# Patient Record
Sex: Female | Born: 1950 | Race: Black or African American | Hispanic: No | Marital: Single | Smoking: Never smoker
Health system: Southern US, Community
[De-identification: ages and names within clinical notes are randomized; demographics above are authoritative.]

## PROBLEM LIST (undated history)

## (undated) DIAGNOSIS — G4730 Sleep apnea, unspecified: Secondary | ICD-10-CM

## (undated) DIAGNOSIS — I10 Essential (primary) hypertension: Secondary | ICD-10-CM

## (undated) DIAGNOSIS — D649 Anemia, unspecified: Secondary | ICD-10-CM

## (undated) DIAGNOSIS — H409 Unspecified glaucoma: Secondary | ICD-10-CM

## (undated) DIAGNOSIS — Z87442 Personal history of urinary calculi: Secondary | ICD-10-CM

## (undated) DIAGNOSIS — J45909 Unspecified asthma, uncomplicated: Secondary | ICD-10-CM

## (undated) DIAGNOSIS — M1990 Unspecified osteoarthritis, unspecified site: Secondary | ICD-10-CM

## (undated) DIAGNOSIS — E119 Type 2 diabetes mellitus without complications: Secondary | ICD-10-CM

## (undated) DIAGNOSIS — H9190 Unspecified hearing loss, unspecified ear: Secondary | ICD-10-CM

## (undated) HISTORY — PX: OTHER SURGICAL HISTORY: SHX169

## (undated) HISTORY — PX: TOE SURGERY: SHX1073

---

## 1996-04-15 HISTORY — PX: ABDOMINAL HYSTERECTOMY: SHX81

## 2003-04-16 HISTORY — PX: KNEE ARTHROSCOPY: SUR90

## 2003-05-20 ENCOUNTER — Other Ambulatory Visit: Payer: Self-pay

## 2004-07-09 ENCOUNTER — Ambulatory Visit: Payer: Self-pay | Admitting: Family Medicine

## 2005-01-17 ENCOUNTER — Ambulatory Visit: Payer: Self-pay | Admitting: Family Medicine

## 2006-12-11 ENCOUNTER — Emergency Department: Payer: Self-pay | Admitting: Emergency Medicine

## 2007-11-25 ENCOUNTER — Ambulatory Visit: Payer: Self-pay | Admitting: Pain Medicine

## 2007-12-02 ENCOUNTER — Ambulatory Visit: Payer: Self-pay | Admitting: Pain Medicine

## 2008-02-22 ENCOUNTER — Ambulatory Visit: Payer: Self-pay | Admitting: Pain Medicine

## 2008-02-24 ENCOUNTER — Ambulatory Visit: Payer: Self-pay | Admitting: Pain Medicine

## 2008-03-29 ENCOUNTER — Ambulatory Visit: Payer: Self-pay | Admitting: Pain Medicine

## 2008-04-04 ENCOUNTER — Ambulatory Visit: Payer: Self-pay

## 2008-04-05 ENCOUNTER — Ambulatory Visit: Payer: Self-pay | Admitting: Pain Medicine

## 2008-04-18 ENCOUNTER — Ambulatory Visit: Payer: Self-pay | Admitting: Pain Medicine

## 2008-04-19 ENCOUNTER — Ambulatory Visit: Payer: Self-pay | Admitting: Family Medicine

## 2008-05-10 ENCOUNTER — Ambulatory Visit: Payer: Self-pay | Admitting: Family Medicine

## 2008-05-10 ENCOUNTER — Ambulatory Visit: Payer: Self-pay | Admitting: Pain Medicine

## 2008-05-25 ENCOUNTER — Ambulatory Visit: Payer: Self-pay | Admitting: Pain Medicine

## 2008-06-22 ENCOUNTER — Ambulatory Visit: Payer: Self-pay | Admitting: Unknown Physician Specialty

## 2008-06-29 ENCOUNTER — Ambulatory Visit: Payer: Self-pay | Admitting: Unknown Physician Specialty

## 2008-12-05 ENCOUNTER — Ambulatory Visit: Payer: Self-pay | Admitting: Pain Medicine

## 2008-12-16 ENCOUNTER — Emergency Department: Payer: Self-pay | Admitting: Emergency Medicine

## 2009-01-03 ENCOUNTER — Ambulatory Visit: Payer: Self-pay | Admitting: Pain Medicine

## 2009-01-04 ENCOUNTER — Ambulatory Visit: Payer: Self-pay | Admitting: Family Medicine

## 2009-01-11 ENCOUNTER — Ambulatory Visit: Payer: Self-pay | Admitting: Pain Medicine

## 2009-01-17 ENCOUNTER — Ambulatory Visit: Payer: Self-pay | Admitting: Pain Medicine

## 2009-02-02 ENCOUNTER — Ambulatory Visit: Payer: Self-pay | Admitting: Family Medicine

## 2009-02-14 ENCOUNTER — Ambulatory Visit: Payer: Self-pay | Admitting: Pain Medicine

## 2009-03-16 ENCOUNTER — Ambulatory Visit: Payer: Self-pay | Admitting: Gastroenterology

## 2009-03-16 HISTORY — PX: COLONOSCOPY: SHX174

## 2009-03-23 ENCOUNTER — Ambulatory Visit: Payer: Self-pay | Admitting: Pain Medicine

## 2009-05-04 ENCOUNTER — Ambulatory Visit: Payer: Self-pay | Admitting: Podiatry

## 2009-08-30 ENCOUNTER — Ambulatory Visit: Payer: Self-pay | Admitting: Unknown Physician Specialty

## 2009-10-24 ENCOUNTER — Ambulatory Visit: Payer: Self-pay | Admitting: Family Medicine

## 2011-02-06 ENCOUNTER — Emergency Department: Payer: Self-pay | Admitting: Emergency Medicine

## 2011-04-24 DIAGNOSIS — H903 Sensorineural hearing loss, bilateral: Secondary | ICD-10-CM | POA: Insufficient documentation

## 2011-07-11 DIAGNOSIS — I10 Essential (primary) hypertension: Secondary | ICD-10-CM | POA: Insufficient documentation

## 2011-07-11 DIAGNOSIS — G8929 Other chronic pain: Secondary | ICD-10-CM | POA: Insufficient documentation

## 2011-07-26 ENCOUNTER — Ambulatory Visit: Payer: Self-pay | Admitting: Family Medicine

## 2011-11-19 ENCOUNTER — Ambulatory Visit: Payer: Self-pay | Admitting: Family Medicine

## 2012-06-22 ENCOUNTER — Emergency Department: Payer: Self-pay | Admitting: Emergency Medicine

## 2012-12-24 ENCOUNTER — Ambulatory Visit: Payer: Self-pay | Admitting: Family Medicine

## 2013-03-16 ENCOUNTER — Ambulatory Visit: Payer: Self-pay | Admitting: Pain Medicine

## 2013-03-23 ENCOUNTER — Ambulatory Visit: Payer: Self-pay | Admitting: Pain Medicine

## 2013-04-18 ENCOUNTER — Emergency Department: Payer: Self-pay | Admitting: Emergency Medicine

## 2013-04-18 LAB — BASIC METABOLIC PANEL
Anion Gap: 3 — ABNORMAL LOW (ref 7–16)
BUN: 16 mg/dL (ref 7–18)
CALCIUM: 10.3 mg/dL — AB (ref 8.5–10.1)
Chloride: 106 mmol/L (ref 98–107)
Co2: 29 mmol/L (ref 21–32)
Creatinine: 0.79 mg/dL (ref 0.60–1.30)
EGFR (African American): >60
EGFR (Non-African Amer.): >60
GLUCOSE: 126 mg/dL — AB (ref 65–99)
Osmolality: 278 (ref 275–301)
POTASSIUM: 4 mmol/L (ref 3.5–5.1)
Sodium: 138 mmol/L (ref 136–145)

## 2013-04-18 LAB — CBC
HCT: 38.6 % (ref 35.0–47.0)
HGB: 12.5 g/dL (ref 12.0–16.0)
MCH: 25.7 pg — ABNORMAL LOW (ref 26.0–34.0)
MCHC: 32.3 g/dL (ref 32.0–36.0)
MCV: 80 fL (ref 80–100)
Platelet: 202 10*3/uL (ref 150–440)
RBC: 4.85 10*6/uL (ref 3.80–5.20)
RDW: 14.1 % (ref 11.5–14.5)
WBC: 5.8 10*3/uL (ref 3.6–11.0)

## 2013-04-18 LAB — TROPONIN I: Troponin-I: <0.02 ng/mL

## 2013-05-05 ENCOUNTER — Ambulatory Visit: Payer: Self-pay | Admitting: Pain Medicine

## 2013-05-06 ENCOUNTER — Other Ambulatory Visit: Payer: Self-pay | Admitting: Neurosurgery

## 2013-05-06 DIAGNOSIS — M48061 Spinal stenosis, lumbar region without neurogenic claudication: Secondary | ICD-10-CM

## 2013-05-06 DIAGNOSIS — M5412 Radiculopathy, cervical region: Secondary | ICD-10-CM

## 2013-05-12 ENCOUNTER — Other Ambulatory Visit: Payer: Self-pay

## 2013-05-13 ENCOUNTER — Other Ambulatory Visit: Payer: Self-pay

## 2013-05-24 ENCOUNTER — Ambulatory Visit
Admission: RE | Admit: 2013-05-24 | Discharge: 2013-05-24 | Disposition: A | Discharge status: Home / Self Care | Payer: PRIVATE HEALTH INSURANCE | Source: Ambulatory Visit | Attending: Neurosurgery | Admitting: Neurosurgery

## 2013-05-24 VITALS — BP 119/65 | HR 67

## 2013-05-24 DIAGNOSIS — M5412 Radiculopathy, cervical region: Secondary | ICD-10-CM

## 2013-05-24 DIAGNOSIS — M48061 Spinal stenosis, lumbar region without neurogenic claudication: Secondary | ICD-10-CM

## 2013-05-24 MED ORDER — MEPERIDINE HCL 100 MG/ML IJ SOLN
100.0000 mg | Freq: Once | INTRAMUSCULAR | Status: AC
  Administered 2013-05-24: 100 mg via INTRAMUSCULAR

## 2013-05-24 MED ORDER — DIAZEPAM 5 MG PO TABS
5.0000 mg | ORAL_TABLET | Freq: Once | ORAL | Status: AC
  Administered 2013-05-24: 5 mg via ORAL

## 2013-05-24 MED ORDER — IOHEXOL 300 MG/ML  SOLN
10.0000 mL | Freq: Once | INTRAMUSCULAR | Status: AC | PRN
  Administered 2013-05-24: 10 mL via INTRATHECAL

## 2013-05-24 MED ORDER — ONDANSETRON HCL 4 MG/2ML IJ SOLN
4.0000 mg | Freq: Once | INTRAMUSCULAR | Status: AC
  Administered 2013-05-24: 4 mg via INTRAMUSCULAR

## 2013-05-24 NOTE — Discharge Instructions (Signed)

## 2013-06-08 ENCOUNTER — Ambulatory Visit: Payer: Self-pay | Admitting: Pain Medicine

## 2013-07-12 ENCOUNTER — Ambulatory Visit: Payer: Self-pay | Admitting: Pain Medicine

## 2013-07-15 ENCOUNTER — Emergency Department: Payer: Self-pay | Admitting: Emergency Medicine

## 2013-07-15 LAB — CBC WITH DIFFERENTIAL/PLATELET
Basophil #: 0.1 10*3/uL (ref 0.0–0.1)
Basophil %: 0.7 %
EOS PCT: 0.3 %
Eosinophil #: 0 10*3/uL (ref 0.0–0.7)
HCT: 40.5 % (ref 35.0–47.0)
HGB: 12.5 g/dL (ref 12.0–16.0)
LYMPHS PCT: 23.6 %
Lymphocyte #: 2.7 10*3/uL (ref 1.0–3.6)
MCH: 25.2 pg — ABNORMAL LOW (ref 26.0–34.0)
MCHC: 30.9 g/dL — AB (ref 32.0–36.0)
MCV: 81 fL (ref 80–100)
MONO ABS: 0.8 x10 3/mm (ref 0.2–0.9)
Monocyte %: 7.5 %
NEUTROS PCT: 67.9 %
Neutrophil #: 7.6 10*3/uL — ABNORMAL HIGH (ref 1.4–6.5)
PLATELETS: 201 10*3/uL (ref 150–440)
RBC: 4.97 10*6/uL (ref 3.80–5.20)
RDW: 14.1 % (ref 11.5–14.5)
WBC: 11.2 10*3/uL — AB (ref 3.6–11.0)

## 2013-07-15 LAB — BASIC METABOLIC PANEL
ANION GAP: 5 — AB (ref 7–16)
BUN: 18 mg/dL (ref 7–18)
CREATININE: 0.74 mg/dL (ref 0.60–1.30)
Calcium, Total: 9.6 mg/dL (ref 8.5–10.1)
Chloride: 106 mmol/L (ref 98–107)
Co2: 28 mmol/L (ref 21–32)
Glucose: 114 mg/dL — ABNORMAL HIGH (ref 65–99)
OSMOLALITY: 280 (ref 275–301)
Potassium: 4.3 mmol/L (ref 3.5–5.1)
SODIUM: 139 mmol/L (ref 136–145)

## 2013-08-10 ENCOUNTER — Ambulatory Visit: Payer: Self-pay | Admitting: Pain Medicine

## 2013-09-04 DIAGNOSIS — E1136 Type 2 diabetes mellitus with diabetic cataract: Secondary | ICD-10-CM | POA: Insufficient documentation

## 2014-01-05 ENCOUNTER — Ambulatory Visit: Payer: Self-pay | Admitting: Family Medicine
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
## 2014-11-07 DIAGNOSIS — E785 Hyperlipidemia, unspecified: Secondary | ICD-10-CM | POA: Insufficient documentation

## 2015-01-02 IMAGING — CR DG CHEST 2V
1 series · 2 of 2 positions shown · non-contrast
Comparison: DG MYELOGRAPHY LUMBAR INJ MULTI REGION dated 05/24/2013;
DG CHEST 2V dated 04/18/2013

CLINICAL DATA: cough; coughed up blood; images best possible

EXAM:
CHEST  2 VIEW

[Series 1: w chest pa · 0.14mm/px · 2 of 2 slices shown]
[im 1/2]
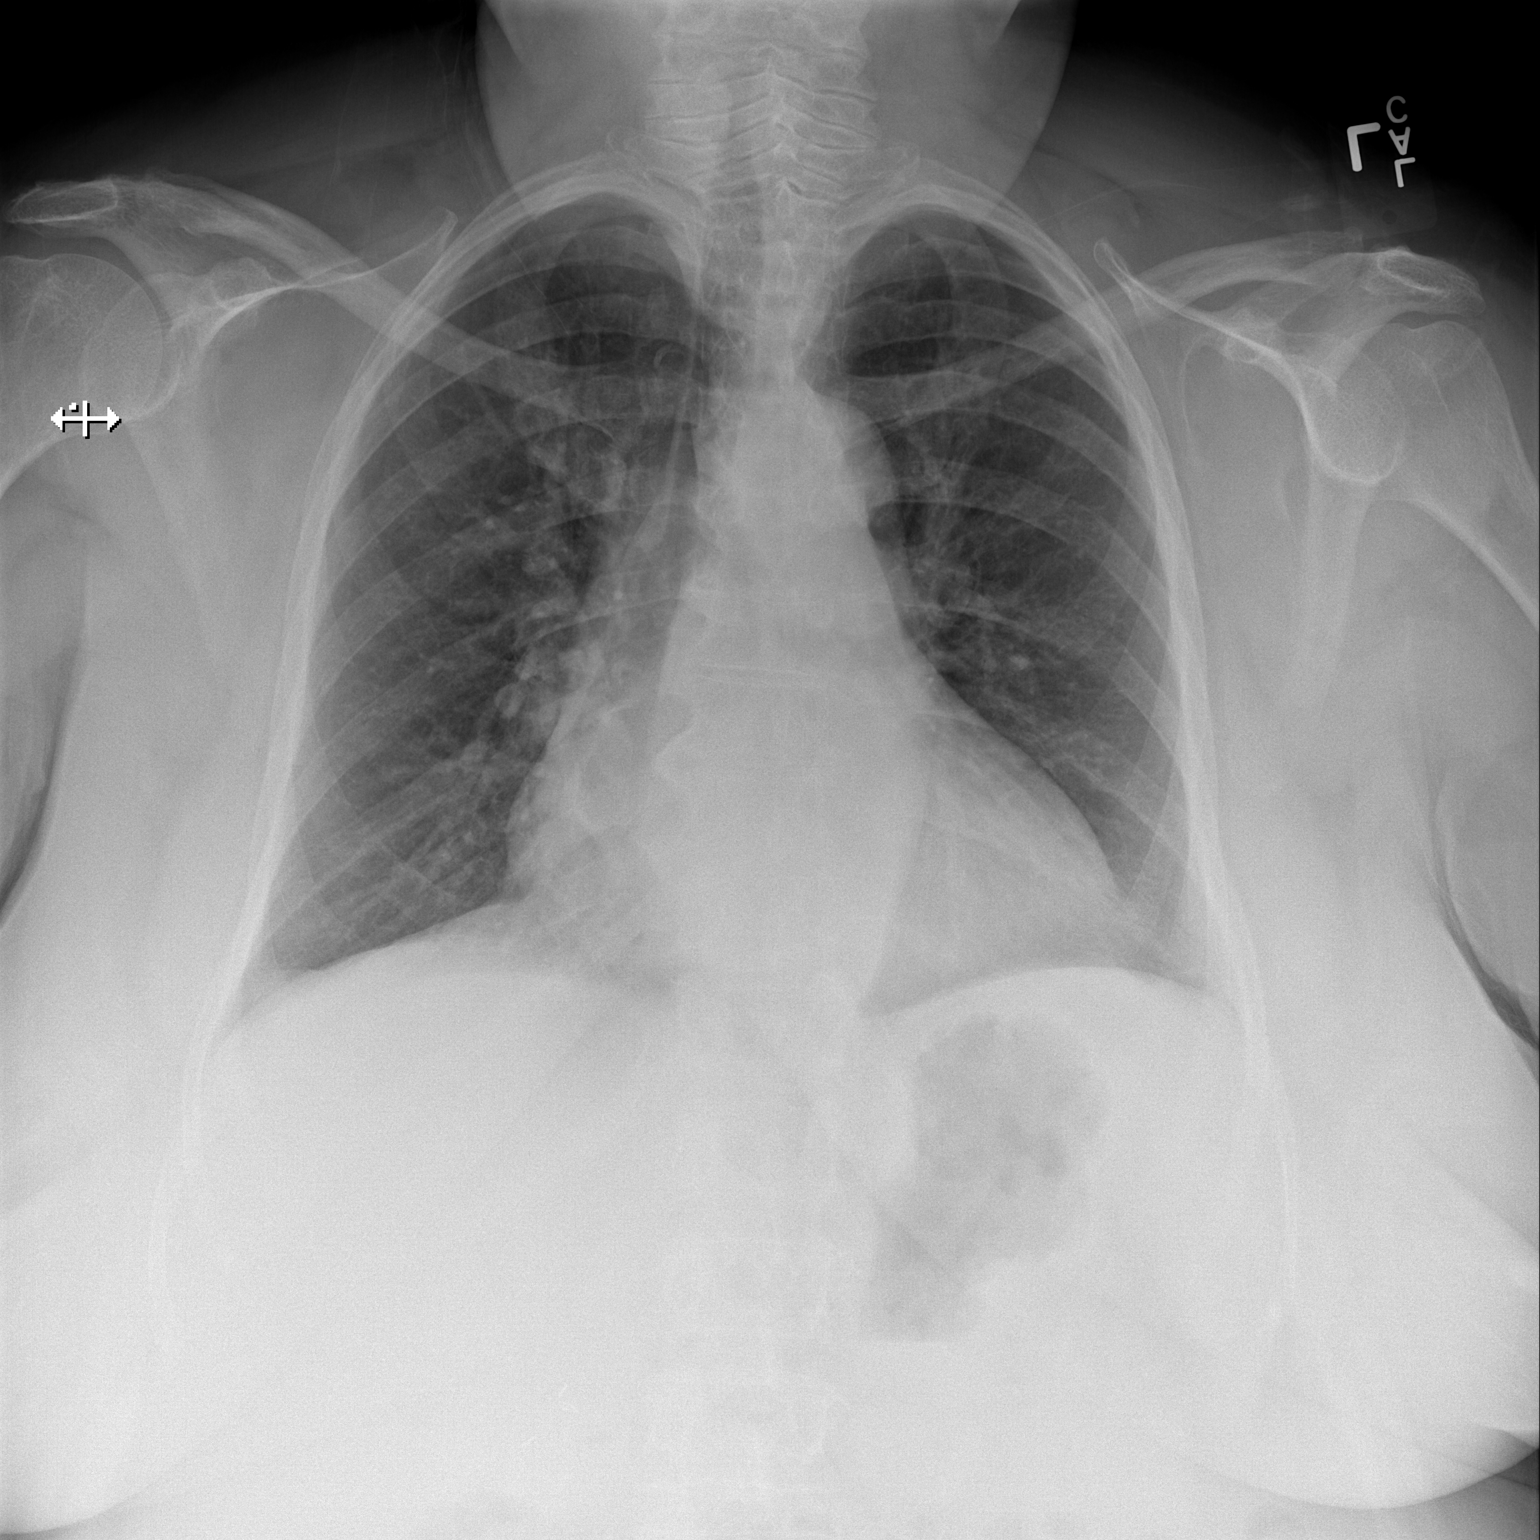
[im 2/2]
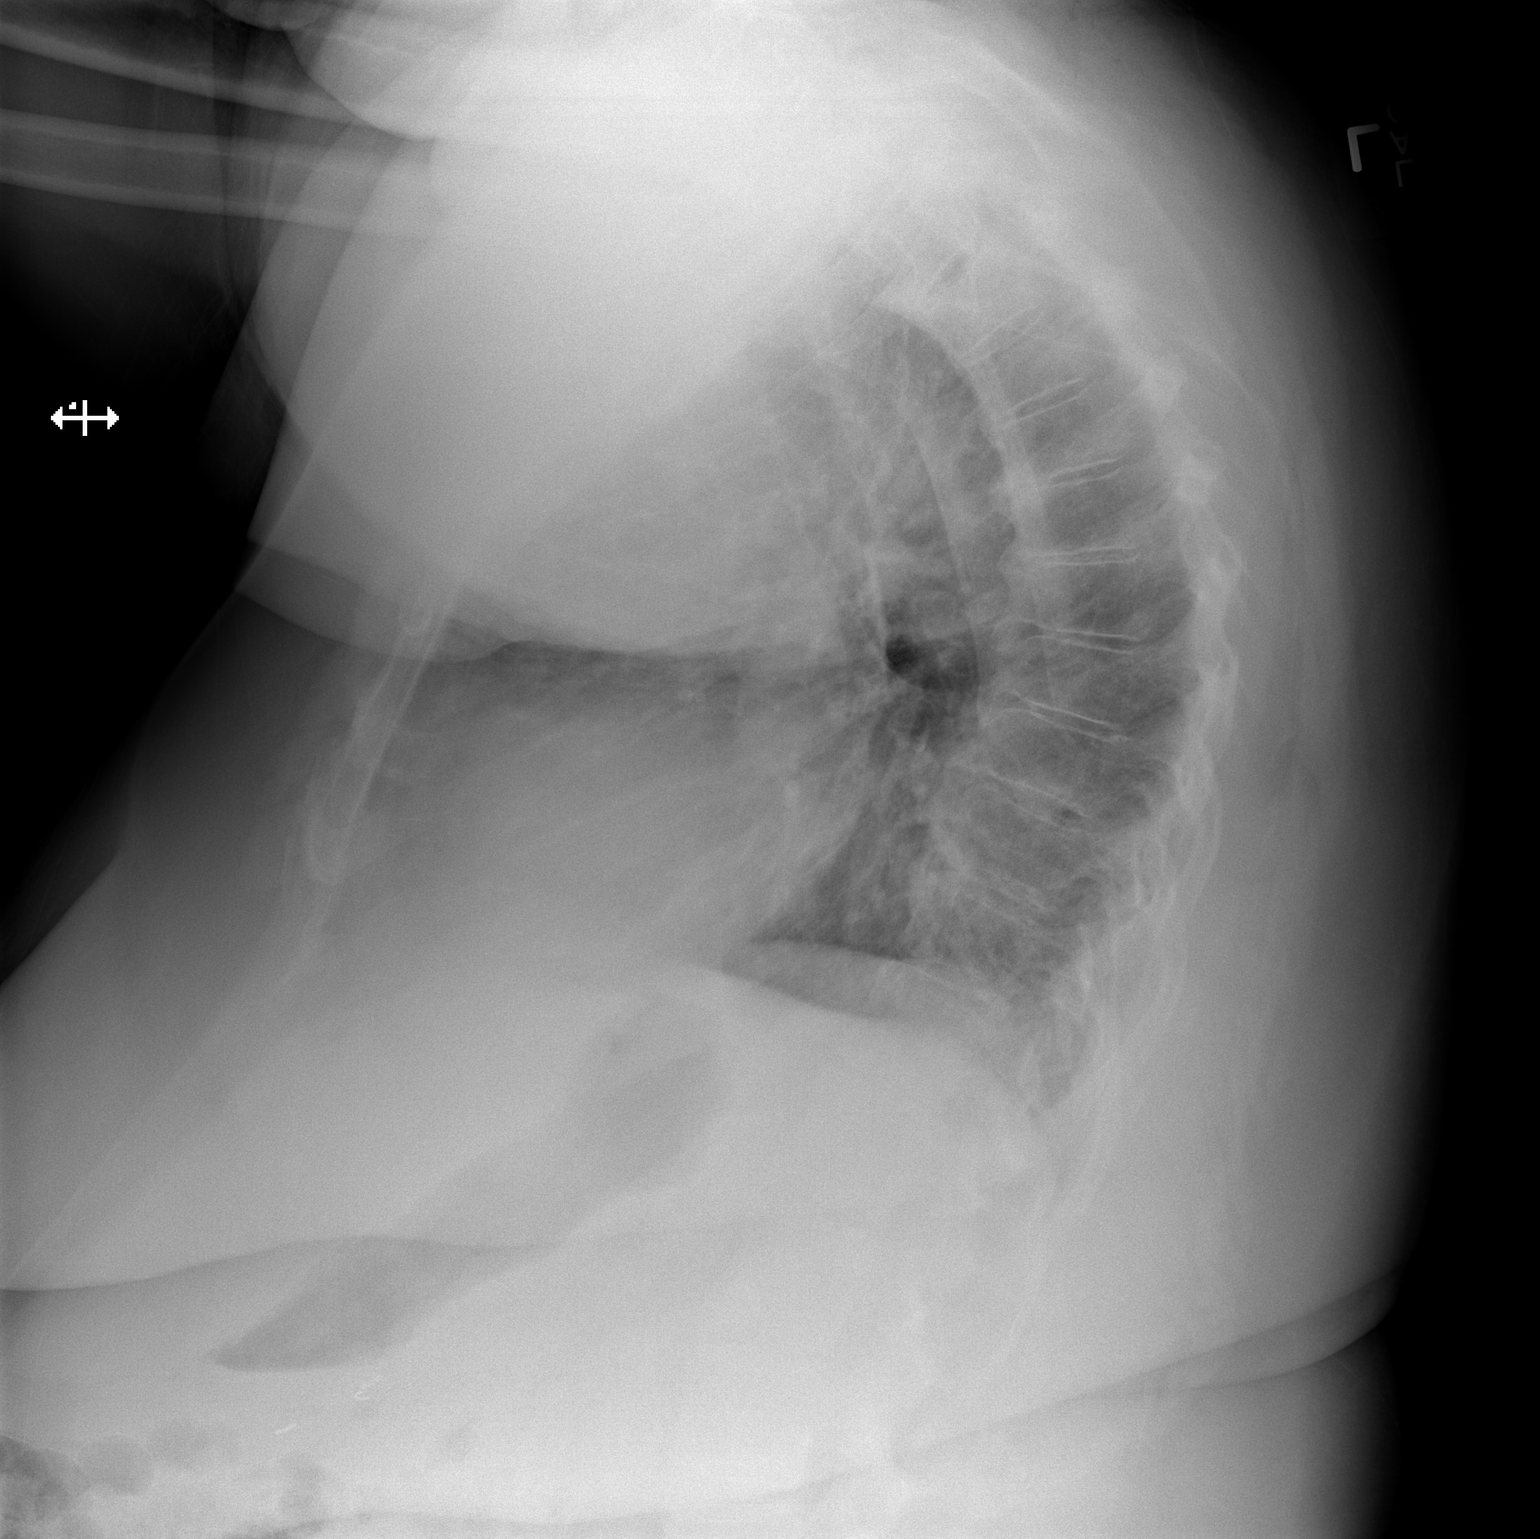

[2 of 2 positions shown; findings below may reference images not displayed]

FINDINGS: Stable cardiomegaly. Low lung volumes. Stable mild central airway
thickening. No focal regions of consolidation or focal infiltrates.
Multilevel spondylolysis thoracic spine, stable. Mild subsegmental
atelectasis left lung base.
IMPRESSION: Stable interstitial changes may reflect a component of bronchitis.
Underlying chronic bronchitic changes also a diagnostic
consideration. No focal regions of consolidation or focal
infiltrates.

Multilevel spondylolysis thoracic spine

## 2015-02-26 ENCOUNTER — Encounter: Payer: Self-pay | Admitting: Emergency Medicine

## 2015-02-26 ENCOUNTER — Emergency Department
Admission: EM | Admit: 2015-02-26 | Discharge: 2015-02-26 | Disposition: A | Discharge status: Home / Self Care | Payer: Medicare Other | Attending: Emergency Medicine | Admitting: Emergency Medicine

## 2015-02-26 DIAGNOSIS — H01115 Allergic dermatitis of left lower eyelid: Secondary | ICD-10-CM | POA: Diagnosis not present

## 2015-02-26 DIAGNOSIS — I10 Essential (primary) hypertension: Secondary | ICD-10-CM | POA: Diagnosis not present

## 2015-02-26 DIAGNOSIS — Z7982 Long term (current) use of aspirin: Secondary | ICD-10-CM | POA: Diagnosis not present

## 2015-02-26 DIAGNOSIS — H5713 Ocular pain, bilateral: Secondary | ICD-10-CM | POA: Diagnosis present

## 2015-02-26 DIAGNOSIS — Z79899 Other long term (current) drug therapy: Secondary | ICD-10-CM | POA: Insufficient documentation

## 2015-02-26 DIAGNOSIS — H01112 Allergic dermatitis of right lower eyelid: Secondary | ICD-10-CM | POA: Insufficient documentation

## 2015-02-26 DIAGNOSIS — E119 Type 2 diabetes mellitus without complications: Secondary | ICD-10-CM | POA: Diagnosis not present

## 2015-02-26 HISTORY — DX: Unspecified asthma, uncomplicated: J45.909

## 2015-02-26 HISTORY — DX: Type 2 diabetes mellitus without complications: E11.9

## 2015-02-26 HISTORY — DX: Unspecified hearing loss, unspecified ear: H91.90

## 2015-02-26 HISTORY — DX: Essential (primary) hypertension: I10

## 2015-02-26 LAB — GLUCOSE, CAPILLARY: Glucose-Capillary: 149 mg/dL — ABNORMAL HIGH (ref 65–99)

## 2015-02-26 MED ORDER — LORATADINE 10 MG PO TABS: 10.0000 mg | ORAL_TABLET | Freq: Every day | ORAL | Status: AC

## 2015-02-26 MED ORDER — DEXAMETHASONE SODIUM PHOSPHATE 10 MG/ML IJ SOLN
10.0000 mg | Freq: Once | INTRAMUSCULAR | Status: AC
  Administered 2015-02-26: 10 mg via INTRAMUSCULAR
  Filled 2015-02-26: qty 1

## 2015-02-26 NOTE — ED Provider Notes (Signed)
[facility] Emergency Department Provider Note  ____________________________________________  Time seen: Approximately 8:44 AM  I have reviewed the triage vital signs and the nursing notes.   HISTORY  Chief Complaint eye pain and swelling   HPI Valerie Maddox is a 64 y.o. female is here with complaint of bilateral lower eyelid swelling since the first part of October. She states she went to her doctor in [place] and was prescribed Azelastine eye drops which are not helping. She states that on the day she saw him that she did not remember that she was dying her hair and got "hair dye" in her eyes. It was seen after that that she began having problems with her lower lids. She denies any visual changes. She does state that these areas constantly itch. There is been no drainage, fever, chills or pain in these areas. She states she cannot follow-up with her eye doctor Cheree Ditto as she is leaving tonight for [place] and will be there until February. She plans on seeing her son's doctor while out in [place] that needs something done today. She states she does watch her blood sugars and generally they've run in the high 90s to low 100s.She states the discomfort that she is in his a 10 out of 10.   Past Medical History  Diagnosis Date  . Hard of hearing   . Diabetes mellitus without complication (HCC)   . Hypertension   . Asthma     There are no active problems to display for this patient.   History reviewed. No pertinent past surgical history.  Current Outpatient Rx  Name  Route  Sig  Dispense  Refill  . aspirin 81 MG tablet   Oral   Take 81 mg by mouth daily.         Marland Kitchen etodolac (LODINE) 200 MG capsule   Oral   Take 200 mg by mouth every 8 (eight) hours.         . fluticasone (FLONASE) 50 MCG/ACT nasal spray   Each Nare   Place 1 spray into both nostrils daily.         Marland Kitchen gabapentin (NEURONTIN) 300 MG capsule   Oral   Take 300 mg by mouth 3 (three)  times daily.         Marland Kitchen lovastatin (MEVACOR) 40 MG tablet   Oral   Take 40 mg by mouth at bedtime.         . metFORMIN (GLUCOPHAGE) 500 MG tablet   Oral   Take 500 mg by mouth 2 (two) times daily with a meal.         . omeprazole (PRILOSEC) 40 MG capsule   Oral   Take 40 mg by mouth daily.         Marland Kitchen loratadine (CLARITIN) 10 MG tablet   Oral   Take 1 tablet (10 mg total) by mouth daily.   30 tablet   2     Allergies Review of patient's allergies indicates no known allergies.  No family history on file.  Social History Social History  Substance Use Topics  . Smoking status: Never Smoker   . Smokeless tobacco: None  . Alcohol Use: No    Review of Systems Constitutional: No fever/chills Eyes: No visual changes. ENT: No sore throat. Cardiovascular: Denies chest pain. Respiratory: Denies shortness of breath. Gastrointestinal:  No nausea, no vomiting.  Skin: Negative for rash. Positive itching Neurological: Negative for headaches, focal weakness or numbness.  10-point ROS  otherwise negative.  ____________________________________________   PHYSICAL EXAM:  VITAL SIGNS: ED Triage Vitals  Enc Vitals Group     BP 02/26/15 0830 140/67 mmHg     Pulse Rate 02/26/15 0830 90     Resp 02/26/15 0830 18     Temp 02/26/15 0830 98.3 F (36.8 C)     Temp Source 02/26/15 0830 Oral     SpO2 02/26/15 0830 97 %     Weight 02/26/15 0830 280 lb (127.007 kg)     Height 02/26/15 0830 5\' 5"  (1.651 m)     Head Cir --      Peak Flow --      Pain Score 02/26/15 0830 10     Pain Loc --      Pain Edu? --      Excl. in GC? --     Constitutional: Alert and oriented. Well appearing and in no acute distress. Eyes: Conjunctivae are normal. PERRL. EOMI. Head: Atraumatic. Nose: No congestion/rhinnorhea. Mouth/Throat: Mucous membranes are moist.  Oropharynx non-erythematous. Neck: No stridor.   Hematological/Lymphatic/Immunilogical: No cervical  lymphadenopathy. Cardiovascular: Normal rate, regular rhythm. Grossly normal heart sounds.  Good peripheral circulation. Respiratory: Normal respiratory effort.  No retractions. Lungs CTAB. Gastrointestinal: Soft and nontender. No distention. Musculoskeletal: No lower extremity tenderness nor edema.  No joint effusions. Neurologic:  Normal speech and language. No gross focal neurologic deficits are appreciated. No gait instability. Skin:  Skin is warm, dry and intact. Bilateral lower eyelids are edematous with minimal erythema. Right is greater then the left. There is no drainage from the eyes. There is no tenderness on palpation. Psychiatric: Mood and affect are normal. Speech and behavior are normal.  ____________________________________________   LABS (all labs ordered are listed, but only abnormal results are displayed)  Labs Reviewed  GLUCOSE, CAPILLARY - Abnormal; Notable for the following:    Glucose-Capillary 149 (*)    All other components within normal limits  CBG MONITORING, ED    PROCEDURES  Procedure(s) performed: None  Critical Care performed: No  ____________________________________________   INITIAL IMPRESSION / ASSESSMENT AND PLAN / ED COURSE  Pertinent labs & imaging results that were available during my care of the patient were reviewed by me and considered in my medical decision making (see chart for details).  Patient given Decadron IM while in the emergency room. She is informed to display watch her fluid intake as well as monitor her blood sugars and continue her metformin. She is also to continue using her eye drops and was given a prescription for Claritin 10 mg one daily to help with itching. She will follow-up with her son's doctor in [place] if any continued problems. ____________________________________________   FINAL CLINICAL IMPRESSION(S) / ED DIAGNOSES  Final diagnoses:  Allergic dermatitis of left lower eyelid  Allergic dermatitis of  right lower eyelid      Tommi RumpsRhonda L Taggart Prasad, PA-C 02/26/15 [id number]  Sharyn CreamerMark Quale, MD 02/26/15 [phone]

## 2015-02-26 NOTE — Discharge Instructions (Signed)
Continue eyedrops as her doctor has prescribed. Claritin 10 mg once a day. Watch those that you're eating and continue monitoring her blood sugar. The medication (Decadron) could cause elevation in your blood sugars which is normal. Follow-up with your family's doctor in [place] if any continued problems.

## 2015-02-26 NOTE — ED Notes (Signed)
Pt states she had had pain and swelling to bilateral eyes since the first part of October, states she went to the Dr. And was given Azelastine but states it is not helping, states she does remember a while back getting "hair dye" in her eyes while dying her hair

## 2015-06-26 ENCOUNTER — Other Ambulatory Visit: Payer: Self-pay | Admitting: Unknown Physician Specialty

## 2015-06-26 DIAGNOSIS — R430 Anosmia: Secondary | ICD-10-CM

## 2015-06-26 DIAGNOSIS — J329 Chronic sinusitis, unspecified: Secondary | ICD-10-CM

## 2015-07-17 ENCOUNTER — Other Ambulatory Visit: Payer: Self-pay | Admitting: Unknown Physician Specialty

## 2015-07-17 DIAGNOSIS — J329 Chronic sinusitis, unspecified: Secondary | ICD-10-CM

## 2015-07-17 DIAGNOSIS — R430 Anosmia: Secondary | ICD-10-CM

## 2015-07-18 ENCOUNTER — Ambulatory Visit: Payer: Medicare Other

## 2015-07-19 ENCOUNTER — Other Ambulatory Visit: Payer: Self-pay | Admitting: Unknown Physician Specialty

## 2015-07-19 DIAGNOSIS — R430 Anosmia: Secondary | ICD-10-CM

## 2015-07-21 ENCOUNTER — Ambulatory Visit
Admission: RE | Admit: 2015-07-21 | Discharge: 2015-07-21 | Disposition: A | Discharge status: Home / Self Care | Payer: Medicare Other | Source: Ambulatory Visit | Attending: Unknown Physician Specialty | Admitting: Unknown Physician Specialty

## 2015-07-21 DIAGNOSIS — I2510 Atherosclerotic heart disease of native coronary artery without angina pectoris: Secondary | ICD-10-CM | POA: Diagnosis not present

## 2015-07-21 DIAGNOSIS — R430 Anosmia: Secondary | ICD-10-CM | POA: Diagnosis present

## 2015-07-21 DIAGNOSIS — Z9621 Cochlear implant status: Secondary | ICD-10-CM | POA: Diagnosis not present

## 2015-07-21 LAB — POCT I-STAT CREATININE: CREATININE: 0.8 mg/dL (ref 0.44–1.00)

## 2015-07-21 MED ORDER — IOPAMIDOL (ISOVUE-300) INJECTION 61%
75.0000 mL | Freq: Once | INTRAVENOUS | Status: AC | PRN
  Administered 2015-07-21: 75 mL via INTRAVENOUS

## 2015-09-19 DIAGNOSIS — G4730 Sleep apnea, unspecified: Secondary | ICD-10-CM | POA: Insufficient documentation

## 2015-11-07 ENCOUNTER — Other Ambulatory Visit: Payer: Self-pay | Admitting: Family Medicine

## 2015-11-07 DIAGNOSIS — Z1231 Encounter for screening mammogram for malignant neoplasm of breast: Secondary | ICD-10-CM

## 2015-11-23 ENCOUNTER — Ambulatory Visit: Payer: Medicare Other

## 2015-12-25 ENCOUNTER — Ambulatory Visit: Payer: Medicare Other

## 2015-12-27 ENCOUNTER — Other Ambulatory Visit: Payer: Self-pay | Admitting: Family Medicine

## 2015-12-27 ENCOUNTER — Ambulatory Visit
Admission: RE | Admit: 2015-12-27 | Discharge: 2015-12-27 | Disposition: A | Discharge status: Home / Self Care | Payer: Medicare Other | Source: Ambulatory Visit | Attending: Family Medicine | Admitting: Family Medicine

## 2015-12-27 DIAGNOSIS — Z1231 Encounter for screening mammogram for malignant neoplasm of breast: Secondary | ICD-10-CM | POA: Diagnosis present

## 2016-10-09 ENCOUNTER — Encounter: Payer: Self-pay | Admitting: Medical Oncology

## 2016-10-09 ENCOUNTER — Emergency Department
Admission: EM | Admit: 2016-10-09 | Discharge: 2016-10-09 | Disposition: A | Discharge status: Home / Self Care | Payer: Medicare Other | Attending: Emergency Medicine | Admitting: Emergency Medicine

## 2016-10-09 DIAGNOSIS — Z7982 Long term (current) use of aspirin: Secondary | ICD-10-CM | POA: Diagnosis not present

## 2016-10-09 DIAGNOSIS — Z803 Family history of malignant neoplasm of breast: Secondary | ICD-10-CM | POA: Diagnosis not present

## 2016-10-09 DIAGNOSIS — Z7984 Long term (current) use of oral hypoglycemic drugs: Secondary | ICD-10-CM | POA: Diagnosis not present

## 2016-10-09 DIAGNOSIS — E119 Type 2 diabetes mellitus without complications: Secondary | ICD-10-CM | POA: Insufficient documentation

## 2016-10-09 DIAGNOSIS — I10 Essential (primary) hypertension: Secondary | ICD-10-CM | POA: Insufficient documentation

## 2016-10-09 DIAGNOSIS — N3001 Acute cystitis with hematuria: Secondary | ICD-10-CM | POA: Diagnosis not present

## 2016-10-09 DIAGNOSIS — J45909 Unspecified asthma, uncomplicated: Secondary | ICD-10-CM | POA: Insufficient documentation

## 2016-10-09 DIAGNOSIS — N3000 Acute cystitis without hematuria: Secondary | ICD-10-CM

## 2016-10-09 DIAGNOSIS — H9190 Unspecified hearing loss, unspecified ear: Secondary | ICD-10-CM | POA: Insufficient documentation

## 2016-10-09 DIAGNOSIS — R300 Dysuria: Secondary | ICD-10-CM | POA: Diagnosis present

## 2016-10-09 LAB — URINALYSIS, COMPLETE (UACMP) WITH MICROSCOPIC
Bilirubin Urine: NEGATIVE
GLUCOSE, UA: NEGATIVE mg/dL
Ketones, ur: NEGATIVE mg/dL
Nitrite: POSITIVE — AB
PH: 5 (ref 5.0–8.0)
Protein, ur: 30 mg/dL — AB
Specific Gravity, Urine: 1.017 (ref 1.005–1.030)

## 2016-10-09 MED ORDER — SULFAMETHOXAZOLE-TRIMETHOPRIM 800-160 MG PO TABS: 1.0000 | ORAL_TABLET | Freq: Two times a day (BID) | ORAL | 0 refills | Status: DC

## 2016-10-09 MED ORDER — SULFAMETHOXAZOLE-TRIMETHOPRIM 800-160 MG PO TABS
1.0000 | ORAL_TABLET | Freq: Once | ORAL | Status: AC
  Administered 2016-10-09: 1 via ORAL
  Filled 2016-10-09: qty 1

## 2016-10-09 MED ORDER — PHENAZOPYRIDINE HCL 100 MG PO TABS: 100.0000 mg | ORAL_TABLET | Freq: Three times a day (TID) | ORAL | 0 refills | Status: AC | PRN

## 2016-10-09 NOTE — ED Triage Notes (Signed)
Pt reports for a month she has been having pain with urination and constantly feels the urge to urinate. Pt also reports pain to left leg, pt is able to ambulate on leg. Reports pain x 78month also. Denies injury. Pt has cochlear implant and is very HOH.

## 2016-10-09 NOTE — ED Provider Notes (Signed)
[facility] Emergency Department Provider Note  ____________________________________________   First MD Initiated Contact with Patient 10/09/16 1508     (approximate)  I have reviewed the triage vital signs and the nursing notes.   HISTORY  Chief Complaint Dysuria   HPI Valerie Maddox is a 66 y.o. female is here with complaint of dysuria for 1 month. Patient states that she constantly feels like she needs to urinate. She states that sleeping at night is difficult due to her frequency and burning. Patient denies any fever or chills, no nausea or vomiting, no flank pain. Patient states that she has had urinary tract infections in the past when she was younger. She states she has been unable to make an appointment with her PCP to have this treated. She states that she has an appointment with her eye doctor. Patient has a cochlear implant is very hard of hearing.   Past Medical History:  Diagnosis Date  . Asthma   . Diabetes mellitus without complication (HCC)   . Hard of hearing   . Hypertension     There are no active problems to display for this patient.   History reviewed. No pertinent surgical history.  Prior to Admission medications   Medication Sig Start Date End Date Taking? Authorizing Provider  aspirin 81 MG tablet Take 81 mg by mouth daily.    [provider]  etodolac (LODINE) 200 MG capsule Take 200 mg by mouth every 8 (eight) hours.    [provider]  fluticasone (FLONASE) 50 MCG/ACT nasal spray Place 1 spray into both nostrils daily.    [provider]  gabapentin (NEURONTIN) 300 MG capsule Take 300 mg by mouth 3 (three) times daily.    [provider]  loratadine (CLARITIN) 10 MG tablet Take 1 tablet (10 mg total) by mouth daily. 02/26/15 02/26/16  Tommi RumpsSummers, Rhonda L, PA-C  lovastatin (MEVACOR) 40 MG tablet Take 40 mg by mouth at bedtime.    [provider]  metFORMIN (GLUCOPHAGE) 500 MG  tablet Take 500 mg by mouth 2 (two) times daily with a meal.    [provider]  omeprazole (PRILOSEC) 40 MG capsule Take 40 mg by mouth daily.    [provider]  phenazopyridine (PYRIDIUM) 100 MG tablet Take 1 tablet (100 mg total) by mouth 3 (three) times daily as needed for pain. 10/09/16 10/09/17  Tommi RumpsSummers, Rhonda L, PA-C  sulfamethoxazole-trimethoprim (BACTRIM DS,SEPTRA DS) 800-160 MG tablet Take 1 tablet by mouth 2 (two) times daily. 10/09/16   Tommi RumpsSummers, Rhonda L, PA-C    Allergies Patient has no known allergies.  Family History  Problem Relation Age of Onset  . Breast cancer Sister   . Breast cancer Sister     Social History Social History  Substance Use Topics  . Smoking status: Never Smoker  . Smokeless tobacco: Not on file  . Alcohol use No    Review of Systems  Constitutional: No fever/chills Eyes: Red eyes, no drainage. Cardiovascular: Denies chest pain. Respiratory: Denies shortness of breath. Gastrointestinal: No abdominal pain.  No nausea, no vomiting.   Genitourinary: Positive for dysuria. Musculoskeletal: Negative for back pain.    ____________________________________________   PHYSICAL EXAM:  VITAL SIGNS: ED Triage Vitals  Enc Vitals Group     BP 10/09/16 1438 (!) 160/46     Pulse Rate 10/09/16 1438 95     Resp 10/09/16 1438 18     Temp 10/09/16 1438 98.4 F (36.9 C)  Temp Source 10/09/16 1438 Oral     SpO2 10/09/16 1438 96 %     Weight 10/09/16 1439 280 lb (127 kg)     Height --      Head Circumference --      Peak Flow --      Pain Score 10/09/16 1438 6     Pain Loc --      Pain Edu? --      Excl. in GC? --     Constitutional: Alert and oriented. Well appearing and in no acute distress. Eyes: Conjunctivae are Erythematous bilaterally without drainage. Head: Atraumatic. Nose: No congestion/rhinnorhea. Neck: No stridor.   Cardiovascular: Normal rate, regular rhythm. Grossly normal heart sounds.  Good peripheral  circulation. Respiratory: Normal respiratory effort.  No retractions. Lungs CTAB. Gastrointestinal: Soft and nontender.  No CVA tenderness. Skin:  Skin is warm, dry and intact. No rash noted. Psychiatric: Mood and affect are normal. Speech and behavior are normal.  ____________________________________________   LABS (all labs ordered are listed, but only abnormal results are displayed)  Labs Reviewed  URINALYSIS, COMPLETE (UACMP) WITH MICROSCOPIC - Abnormal; Notable for the following:       Result Value   Color, Urine YELLOW (*)    APPearance CLOUDY (*)    Hgb urine dipstick SMALL (*)    Protein, ur 30 (*)    Nitrite POSITIVE (*)    Leukocytes, UA LARGE (*)    Bacteria, UA FEW (*)    Squamous Epithelial / LPF 0-5 (*)    All other components within normal limits  URINE CULTURE     PROCEDURES  Procedure(s) performed: None  Procedures  Critical Care performed: No  ____________________________________________   INITIAL IMPRESSION / ASSESSMENT AND PLAN / ED COURSE  Pertinent labs & imaging results that were available during my care of the patient were reviewed by me and considered in my medical decision making (see chart for details).  Patient states she has an appointment to see her eye doctor for her ongoing problem. She was started on Bactrim DS in the emergency department and given a prescription for the same for the next 10 days. She is also given a prescription for peridium 3 times a day for 3 days to help with frequency and dysuria. She is encouraged to drink lots of fluids. She will call make an appointment with Dr. Terance Hart who is her PCP for recheck of her urine.   ___________________________________________   FINAL CLINICAL IMPRESSION(S) / ED DIAGNOSES  Final diagnoses:  Acute cystitis without hematuria      NEW MEDICATIONS STARTED DURING THIS VISIT:  New Prescriptions   PHENAZOPYRIDINE (PYRIDIUM) 100 MG TABLET    Take 1 tablet (100 mg total) by mouth  3 (three) times daily as needed for pain.   SULFAMETHOXAZOLE-TRIMETHOPRIM (BACTRIM DS,SEPTRA DS) 800-160 MG TABLET    Take 1 tablet by mouth 2 (two) times daily.     Note:  This document was prepared using Dragon voice recognition software and may include unintentional dictation errors.    Tommi Rumps, PA-C 10/09/16 1634    Sharman Cheek, MD 10/11/16 [phone]

## 2016-10-09 NOTE — Discharge Instructions (Signed)
Take Pyridium when you get the prescription filled. This will help decrease your frequency. It will cause a change in the color of your urine. Do not be alarmed when this happens. You were given your first dose of antibiotics in the emergency room. Try to take this medication every 12 hours.  This will make her next dose first thing in the morning. Call your primary care doctor to make an appointment for recheck of your urine. Keep your appointment with her eye doctor.

## 2016-10-12 LAB — URINE CULTURE
Culture: >=100000 — AB
Special Requests: NORMAL

## 2016-12-11 ENCOUNTER — Other Ambulatory Visit: Payer: Self-pay | Admitting: Family Medicine

## 2016-12-11 DIAGNOSIS — Z1239 Encounter for other screening for malignant neoplasm of breast: Secondary | ICD-10-CM

## 2017-02-05 ENCOUNTER — Ambulatory Visit
Admission: RE | Admit: 2017-02-05 | Discharge: 2017-02-05 | Disposition: A | Discharge status: Home / Self Care | Payer: Medicare Other | Source: Ambulatory Visit | Attending: Family Medicine | Admitting: Family Medicine

## 2017-02-05 ENCOUNTER — Other Ambulatory Visit: Payer: Self-pay | Admitting: Family Medicine

## 2017-02-05 DIAGNOSIS — Z1231 Encounter for screening mammogram for malignant neoplasm of breast: Secondary | ICD-10-CM | POA: Insufficient documentation

## 2017-02-05 DIAGNOSIS — Z1239 Encounter for other screening for malignant neoplasm of breast: Secondary | ICD-10-CM

## 2017-11-21 ENCOUNTER — Other Ambulatory Visit: Payer: Self-pay

## 2017-11-21 ENCOUNTER — Emergency Department: Payer: Medicare Other

## 2017-11-21 ENCOUNTER — Emergency Department
Admission: EM | Admit: 2017-11-21 | Discharge: 2017-11-21 | Disposition: A | Discharge status: Home / Self Care | Payer: Medicare Other | Attending: Emergency Medicine | Admitting: Emergency Medicine

## 2017-11-21 DIAGNOSIS — R0602 Shortness of breath: Secondary | ICD-10-CM | POA: Insufficient documentation

## 2017-11-21 DIAGNOSIS — J45909 Unspecified asthma, uncomplicated: Secondary | ICD-10-CM | POA: Insufficient documentation

## 2017-11-21 DIAGNOSIS — I10 Essential (primary) hypertension: Secondary | ICD-10-CM | POA: Diagnosis not present

## 2017-11-21 DIAGNOSIS — Z7982 Long term (current) use of aspirin: Secondary | ICD-10-CM | POA: Diagnosis not present

## 2017-11-21 DIAGNOSIS — Z79899 Other long term (current) drug therapy: Secondary | ICD-10-CM | POA: Diagnosis not present

## 2017-11-21 DIAGNOSIS — N9089 Other specified noninflammatory disorders of vulva and perineum: Secondary | ICD-10-CM

## 2017-11-21 DIAGNOSIS — Z7984 Long term (current) use of oral hypoglycemic drugs: Secondary | ICD-10-CM | POA: Insufficient documentation

## 2017-11-21 DIAGNOSIS — M19012 Primary osteoarthritis, left shoulder: Secondary | ICD-10-CM | POA: Insufficient documentation

## 2017-11-21 DIAGNOSIS — M25512 Pain in left shoulder: Secondary | ICD-10-CM | POA: Diagnosis present

## 2017-11-21 DIAGNOSIS — E119 Type 2 diabetes mellitus without complications: Secondary | ICD-10-CM | POA: Diagnosis not present

## 2017-11-21 LAB — CBC WITH DIFFERENTIAL/PLATELET
Basophils Absolute: 0 10*3/uL (ref 0–0.1)
Basophils Relative: 1 %
Eosinophils Absolute: 0.1 10*3/uL (ref 0–0.7)
Eosinophils Relative: 2 %
HCT: 37.2 % (ref 35.0–47.0)
HEMOGLOBIN: 12.2 g/dL (ref 12.0–16.0)
LYMPHS ABS: 2.3 10*3/uL (ref 1.0–3.6)
LYMPHS PCT: 30 %
MCH: 26.5 pg (ref 26.0–34.0)
MCHC: 32.7 g/dL (ref 32.0–36.0)
MCV: 81 fL (ref 80.0–100.0)
Monocytes Absolute: 0.6 10*3/uL (ref 0.2–0.9)
Monocytes Relative: 8 %
NEUTROS ABS: 4.7 10*3/uL (ref 1.4–6.5)
NEUTROS PCT: 59 %
Platelets: 239 10*3/uL (ref 150–440)
RBC: 4.6 MIL/uL (ref 3.80–5.20)
RDW: 15 % — ABNORMAL HIGH (ref 11.5–14.5)
WBC: 7.8 10*3/uL (ref 3.6–11.0)

## 2017-11-21 LAB — BASIC METABOLIC PANEL
Anion gap: 9 (ref 5–15)
BUN: 15 mg/dL (ref 8–23)
CHLORIDE: 103 mmol/L (ref 98–111)
CO2: 27 mmol/L (ref 22–32)
Calcium: 10.3 mg/dL (ref 8.9–10.3)
Creatinine, Ser: 0.8 mg/dL (ref 0.44–1.00)
GFR calc Af Amer: >60 mL/min (ref >60)
GFR calc non Af Amer: >60 mL/min (ref >60)
GLUCOSE: 188 mg/dL — AB (ref 70–99)
POTASSIUM: 3.8 mmol/L (ref 3.5–5.1)
SODIUM: 139 mmol/L (ref 135–145)

## 2017-11-21 LAB — BRAIN NATRIURETIC PEPTIDE: B NATRIURETIC PEPTIDE 5: 11 pg/mL (ref 0.0–100.0)

## 2017-11-21 LAB — TROPONIN I: Troponin I: <0.03 ng/mL (ref <0.03)

## 2017-11-21 MED ORDER — MUPIROCIN 2 % EX OINT: TOPICAL_OINTMENT | CUTANEOUS | 0 refills | Status: AC

## 2017-11-21 MED ORDER — TRAMADOL HCL 50 MG PO TABS: 50.0000 mg | ORAL_TABLET | Freq: Four times a day (QID) | ORAL | 0 refills | Status: DC | PRN

## 2017-11-21 NOTE — ED Triage Notes (Signed)
Pt states post left shoulder surgery a few years ago, pain increasing over the past few days, also, vaginal discharge and pt states a "knot" on the outside of her vaginal area.

## 2017-11-21 NOTE — ED Provider Notes (Signed)
[facility] Emergency Department Provider Note  ____________________________________________  Time seen: Approximately 3:22 PM  I have reviewed the triage vital signs and the nursing notes.   HISTORY  Chief Complaint Shoulder Pain and Vaginitis    HPI Valerie Maddox is a 67 y.o. female who presents to the emergency department for treatment and evaluation of shoulder pain, shortness of breath and a "knot" on the outside of the vagina.   She has been evaluated by her primary care provider for all complaints approximately 2 months ago.  Patient states that he gave her Voltaren gel for her shoulder which has not provided very much relief.  The shoulder pain is preventing her from being able to sleep.  She states that Dr. Rivka SaferBraunstein was unable to find the lesion on her labia when she was there last.  She denies fever.  Significant past medical history includes asthma and diabetes.  Past Medical History:  Diagnosis Date  . Asthma   . Diabetes mellitus without complication (HCC)   . Hard of hearing   . Hypertension     There are no active problems to display for this patient.   No past surgical history on file.  Prior to Admission medications   Medication Sig Start Date End Date Taking? Authorizing Provider  aspirin 81 MG tablet Take 81 mg by mouth daily.    [provider]  etodolac (LODINE) 200 MG capsule Take 200 mg by mouth every 8 (eight) hours.    [provider]  fluticasone (FLONASE) 50 MCG/ACT nasal spray Place 1 spray into both nostrils daily.    [provider]  gabapentin (NEURONTIN) 300 MG capsule Take 300 mg by mouth 3 (three) times daily.    [provider]  loratadine (CLARITIN) 10 MG tablet Take 1 tablet (10 mg total) by mouth daily. 02/26/15 02/26/16  Tommi RumpsSummers, Rhonda L, PA-C  lovastatin (MEVACOR) 40 MG tablet Take 40 mg by mouth at bedtime.    [provider]  metFORMIN (GLUCOPHAGE) 500 MG tablet  Take 500 mg by mouth 2 (two) times daily with a meal.    [provider]  mupirocin ointment (BACTROBAN) 2 % Apply to affected area 3 times daily 11/21/17 11/21/18  Jenene Kauffmann, Rulon Eisenmengerari B, FNP  omeprazole (PRILOSEC) 40 MG capsule Take 40 mg by mouth daily.    [provider]  sulfamethoxazole-trimethoprim (BACTRIM DS,SEPTRA DS) 800-160 MG tablet Take 1 tablet by mouth 2 (two) times daily. 10/09/16   Tommi RumpsSummers, Rhonda L, PA-C  traMADol (ULTRAM) 50 MG tablet Take 1 tablet (50 mg total) by mouth every 6 (six) hours as needed. 11/21/17   Chinita Pesterriplett, Gibran Veselka B, FNP    Allergies Patient has no known allergies.  Family History  Problem Relation Age of Onset  . Breast cancer Sister   . Breast cancer Sister     Social History Social History   Tobacco Use  . Smoking status: Never Smoker  Substance Use Topics  . Alcohol use: No  . Drug use: Not on file    Review of Systems Constitutional: Negative for fever. Respiratory: Positive for shortness of breath Negative for cough. Gastrointestinal: Positive for abdominal heaviness; negative for nausea , negative for vomiting. Genitourinary: Negative for dysuria , Negative for vaginal discharge. Musculoskeletal: Negative for back pain. Skin: Positive for lesion on the labia. ____________________________________________   PHYSICAL EXAM:  VITAL SIGNS: ED Triage Vitals  Enc Vitals Group     BP 11/21/17 1508 120/60     Pulse  Rate 11/21/17 1508 91     Resp 11/21/17 1508 18     Temp 11/21/17 1508 98.1 F (36.7 C)     Temp Source 11/21/17 1508 Oral     SpO2 11/21/17 1508 97 %     Weight 11/21/17 1509 272 lb (123.4 kg)     Height 11/21/17 1509 5\' 5"  (1.651 m)     Head Circumference --      Peak Flow --      Pain Score 11/21/17 1506 7     Pain Loc --      Pain Edu? --      Excl. in GC? --     Constitutional: Alert and oriented. Well appearing and in no acute distress. Eyes: Conjunctivae are normal. Head: Atraumatic. Nose: No  congestion/rhinnorhea. Mouth/Throat: Mucous membranes are moist. Respiratory: Normal respiratory effort.  No retractions. Gastrointestinal: Bowel sounds active x 4; Abdomen is soft without rebound or guarding. Genitourinary: Pelvic exam: Papule on inner labia minora without induration or surrounding erythema. No vaginal discharge.  Musculoskeletal: FROM of the left shoulder which produces diffuse pain. No focal area of tenderness.  Neurologic:  Normal speech and language. No gross focal neurologic deficits are appreciated. Speech is normal. No gait instability. Skin:  Skin is warm, dry and intact.  Psychiatric: Mood and affect are normal. Speech and behavior are normal.  ____________________________________________   LABS (all labs ordered are listed, but only abnormal results are displayed)  Labs Reviewed  CBC WITH DIFFERENTIAL/PLATELET - Abnormal; Notable for the following components:      Result Value   RDW 15.0 (*)    All other components within normal limits  BASIC METABOLIC PANEL - Abnormal; Notable for the following components:   Glucose, Bld 188 (*)    All other components within normal limits  TROPONIN I  BRAIN NATRIURETIC PEPTIDE   ____________________________________________  RADIOLOGY  Chest x-ray negative per radiology.  Mild osteoarthritis on image of left shoulder. ____________________________________________  Procedures  ____________________________________________  67 year old female presenting to the emergency department for treatment and evaluation of left shoulder pain, abdominal heaviness, shortness of breath, and a lesion on her labia. Findings suggestive of osteoarthritis on image of her shoulder, otherwise x-ray findings are reassuring.  She was advised to follow-up with her primary care provider for further investigation of the heaviness that she feels in her stomach.  She will be given mupirocin ointment to applied to the lesion and very small number of  tramadol to help with her shoulder pain so she may get some sleep at night.  She was encouraged to return to the emergency department for symptoms of change or worsen if she is unable to schedule an appointment with her primary care provider.  INITIAL IMPRESSION / ASSESSMENT AND PLAN / ED COURSE  Pertinent labs & imaging results that were available during my care of the patient were reviewed by me and considered in my medical decision making (see chart for details).  ____________________________________________   FINAL CLINICAL IMPRESSION(S) / ED DIAGNOSES  Final diagnoses:  Osteoarthritis of left shoulder, unspecified osteoarthritis type  Labial lesion    Note:  This document was prepared using Dragon voice recognition software and may include unintentional dictation errors.    Chinita Pester, FNP 11/21/17 2146    Phineas Semen, MD 11/21/17 2153

## 2017-11-21 NOTE — ED Notes (Signed)
Pt is HOH, reads lips.

## 2017-11-21 NOTE — Discharge Instructions (Signed)
The lesion on your labia is small and does not require draining. If it gets larger and more painful, schedule an appointment with your doctor.   Follow up with orthopedics if your shoulder pain gets worse.  Return to the ER for symptoms that change or worsen if unable to schedule an appointment.

## 2017-11-21 NOTE — ED Provider Notes (Signed)
ED ECG REPORT I, Willy EddyPatrick Cid Agena, the attending physician, personally viewed and interpreted this ECG.   Date: 11/21/2017  EKG Time: 16:18  Rate: 85  Rhythm: sinus  Axis: normal  Intervals: normal intervals  ST&T Change: no stemi    Willy Eddyobinson, Davinity Fanara, MD 11/21/17 [phone]

## 2017-11-21 NOTE — ED Notes (Signed)
Patient transported to X-ray 

## 2017-12-31 ENCOUNTER — Other Ambulatory Visit: Payer: Self-pay | Admitting: Family Medicine

## 2017-12-31 DIAGNOSIS — Z1231 Encounter for screening mammogram for malignant neoplasm of breast: Secondary | ICD-10-CM

## 2018-02-06 ENCOUNTER — Ambulatory Visit
Admission: RE | Admit: 2018-02-06 | Discharge: 2018-02-06 | Disposition: A | Discharge status: Home / Self Care | Payer: Medicare Other | Source: Ambulatory Visit | Attending: Family Medicine | Admitting: Family Medicine

## 2018-02-06 DIAGNOSIS — Z1231 Encounter for screening mammogram for malignant neoplasm of breast: Secondary | ICD-10-CM

## 2018-04-10 ENCOUNTER — Emergency Department
Admission: EM | Admit: 2018-04-10 | Discharge: 2018-04-10 | Disposition: A | Discharge status: Home / Self Care | Payer: Medicare Other | Attending: Student in an Organized Health Care Education/Training Program | Admitting: Student in an Organized Health Care Education/Training Program

## 2018-04-10 ENCOUNTER — Other Ambulatory Visit: Payer: Self-pay

## 2018-04-10 ENCOUNTER — Encounter: Payer: Self-pay | Admitting: Emergency Medicine

## 2018-04-10 DIAGNOSIS — J019 Acute sinusitis, unspecified: Secondary | ICD-10-CM | POA: Insufficient documentation

## 2018-04-10 DIAGNOSIS — B9689 Other specified bacterial agents as the cause of diseases classified elsewhere: Secondary | ICD-10-CM | POA: Diagnosis not present

## 2018-04-10 DIAGNOSIS — I10 Essential (primary) hypertension: Secondary | ICD-10-CM | POA: Insufficient documentation

## 2018-04-10 DIAGNOSIS — Z79899 Other long term (current) drug therapy: Secondary | ICD-10-CM | POA: Insufficient documentation

## 2018-04-10 DIAGNOSIS — E119 Type 2 diabetes mellitus without complications: Secondary | ICD-10-CM | POA: Insufficient documentation

## 2018-04-10 DIAGNOSIS — R05 Cough: Secondary | ICD-10-CM | POA: Diagnosis present

## 2018-04-10 DIAGNOSIS — J45909 Unspecified asthma, uncomplicated: Secondary | ICD-10-CM | POA: Insufficient documentation

## 2018-04-10 DIAGNOSIS — J329 Chronic sinusitis, unspecified: Secondary | ICD-10-CM

## 2018-04-10 LAB — INFLUENZA PANEL BY PCR (TYPE A & B)
INFLBPCR: NEGATIVE
Influenza A By PCR: NEGATIVE

## 2018-04-10 MED ORDER — AMOXICILLIN-POT CLAVULANATE 875-125 MG PO TABS
1.0000 | ORAL_TABLET | Freq: Once | ORAL | Status: AC
  Administered 2018-04-10: 1 via ORAL
  Filled 2018-04-10: qty 1

## 2018-04-10 MED ORDER — TRIAMCINOLONE ACETONIDE 55 MCG/ACT NA AERO: 1.0000 | INHALATION_SPRAY | Freq: Two times a day (BID) | NASAL | 0 refills | Status: AC

## 2018-04-10 MED ORDER — PSEUDOEPH-BROMPHEN-DM 30-2-10 MG/5ML PO SYRP: 10.0000 mL | ORAL_SOLUTION | Freq: Four times a day (QID) | ORAL | 0 refills | Status: DC | PRN

## 2018-04-10 MED ORDER — AMOXICILLIN-POT CLAVULANATE 875-125 MG PO TABS: 1.0000 | ORAL_TABLET | Freq: Two times a day (BID) | ORAL | 0 refills | Status: DC

## 2018-04-10 NOTE — ED Notes (Signed)

## 2018-04-10 NOTE — ED Notes (Signed)
See triage note  Presents with sinus pressure and nasal drainage  States she has had green drainage from nose  also throat has been slightly sore afebrile on arrival

## 2018-04-10 NOTE — ED Triage Notes (Addendum)
Pt arrived via POV with reports of 3 days of productive cough, sore throat runny nose and congestion.  Pt is baseline hard of hearing.  Denies fevers or chills.

## 2018-04-10 NOTE — ED Provider Notes (Signed)
[facility] Emergency Department Provider Note  ____________________________________________  Time seen: Approximately 6:40 PM  I have reviewed the triage vital signs and the nursing notes.   HISTORY  Chief Complaint Cough and Nasal Congestion    HPI Valerie Maddox is a 67 y.o. female who presents the emergency department complaining of nasal congestion, sinus pressure, cough.  Patient reports 3-day history of symptoms.  Patient reports a low-grade subjective fever.  No chills.  Patient denies any headache, visual changes, chest pain, shortness of breath, domino pain, nausea or vomiting.  Patient does have a history of asthma, diabetes, hypertension.  No complaints with chronic medical problems.  Patient has not tried any medications for the symptoms prior to arrival.    Past Medical History:  Diagnosis Date  . Asthma   . Diabetes mellitus without complication (HCC)   . Hard of hearing   . Hypertension     There are no active problems to display for this patient.   History reviewed. No pertinent surgical history.  Prior to Admission medications   Medication Sig Start Date End Date Taking? Authorizing Provider  amoxicillin-clavulanate (AUGMENTIN) 875-125 MG tablet Take 1 tablet by mouth 2 (two) times daily. 04/10/18   , Delorise RoyalsJonathan D, PA-C  aspirin 81 MG tablet Take 81 mg by mouth daily.    [provider]  brompheniramine-pseudoephedrine-DM 30-2-10 MG/5ML syrup Take 10 mLs by mouth 4 (four) times daily as needed. 04/10/18   , Delorise RoyalsJonathan D, PA-C  etodolac (LODINE) 200 MG capsule Take 200 mg by mouth every 8 (eight) hours.    [provider]  fluticasone (FLONASE) 50 MCG/ACT nasal spray Place 1 spray into both nostrils daily.    [provider]  gabapentin (NEURONTIN) 300 MG capsule Take 300 mg by mouth 3 (three) times daily.    [provider]  loratadine (CLARITIN) 10 MG tablet Take 1 tablet (10 mg total)  by mouth daily. 02/26/15 02/26/16  Tommi RumpsSummers, Rhonda L, PA-C  lovastatin (MEVACOR) 40 MG tablet Take 40 mg by mouth at bedtime.    [provider]  metFORMIN (GLUCOPHAGE) 500 MG tablet Take 500 mg by mouth 2 (two) times daily with a meal.    [provider]  mupirocin ointment (BACTROBAN) 2 % Apply to affected area 3 times daily 11/21/17 11/21/18  Triplett, Rulon Eisenmengerari B, FNP  omeprazole (PRILOSEC) 40 MG capsule Take 40 mg by mouth daily.    [provider]  sulfamethoxazole-trimethoprim (BACTRIM DS,SEPTRA DS) 800-160 MG tablet Take 1 tablet by mouth 2 (two) times daily. 10/09/16   Tommi RumpsSummers, Rhonda L, PA-C  traMADol (ULTRAM) 50 MG tablet Take 1 tablet (50 mg total) by mouth every 6 (six) hours as needed. 11/21/17   Triplett, Rulon Eisenmengerari B, FNP  triamcinolone (NASACORT) 55 MCG/ACT AERO nasal inhaler Place 1 spray into the nose 2 (two) times daily. 04/10/18 04/10/19  , Delorise RoyalsJonathan D, PA-C    Allergies Fluticasone  Family History  Problem Relation Age of Onset  . Breast cancer Sister   . Breast cancer Sister     Social History Social History   Tobacco Use  . Smoking status: Never Smoker  . Smokeless tobacco: Never Used  Substance Use Topics  . Alcohol use: No  . Drug use: Not on file     Review of Systems  Constitutional: Subjective low-grade fever/chills Eyes: No visual changes. No discharge ENT: Positive for nasal congestion and sinus pressure Cardiovascular: no chest pain. Respiratory: Positive cough. No SOB. Gastrointestinal: No  abdominal pain.  No nausea, no vomiting.  No diarrhea.  No constipation. Musculoskeletal: Negative for musculoskeletal pain. Skin: Negative for rash, abrasions, lacerations, ecchymosis. Neurological: Negative for headaches, focal weakness or numbness. 10-point ROS otherwise negative.  ____________________________________________   PHYSICAL EXAM:  VITAL SIGNS: ED Triage Vitals  Enc Vitals Group     BP 04/10/18 1740 118/62     Pulse  Rate 04/10/18 1740 90     Resp 04/10/18 1740 16     Temp 04/10/18 1740 98.5 F (36.9 C)     Temp Source 04/10/18 1740 Oral     SpO2 04/10/18 1740 98 %     Weight 04/10/18 1741 272 lb (123.4 kg)     Height 04/10/18 1741 5\' 6"  (1.676 m)     Head Circumference --      Peak Flow --      Pain Score 04/10/18 1746 0     Pain Loc --      Pain Edu? --      Excl. in GC? --      Constitutional: Alert and oriented. Well appearing and in no acute distress. Eyes: Conjunctivae are normal. PERRL. EOMI. Head: Atraumatic. ENT:      Ears: EACs and TMs unremarkable bilaterally.      Nose: Moderate to significant congestion/rhinnorhea.  Patient is tender to percussion over the maxillary sinuses.      Mouth/Throat: Mucous membranes are moist.  Oropharynx is nonerythematous and nonedematous.  Uvula is midline. Neck: No stridor.   Hematological/Lymphatic/Immunilogical: No cervical lymphadenopathy. Cardiovascular: Normal rate, regular rhythm. Normal S1 and S2.  Good peripheral circulation. Respiratory: Normal respiratory effort without tachypnea or retractions. Lungs CTAB. Good air entry to the bases with no decreased or absent breath sounds. Musculoskeletal: Full range of motion to all extremities. No gross deformities appreciated. Neurologic:  Normal speech and language. No gross focal neurologic deficits are appreciated.  Skin:  Skin is warm, dry and intact. No rash noted. Psychiatric: Mood and affect are normal. Speech and behavior are normal. Patient exhibits appropriate insight and judgement.   ____________________________________________   LABS (all labs ordered are listed, but only abnormal results are displayed)  Labs Reviewed  INFLUENZA PANEL BY PCR (TYPE A & B)   ____________________________________________  EKG   ____________________________________________  RADIOLOGY   No results found.  ____________________________________________    PROCEDURES  Procedure(s)  performed:    Procedures    Medications  amoxicillin-clavulanate (AUGMENTIN) 875-125 MG per tablet 1 tablet (has no administration in time range)     ____________________________________________   INITIAL IMPRESSION / ASSESSMENT AND PLAN / ED COURSE  Pertinent labs & imaging results that were available during my care of the patient were reviewed by me and considered in my medical decision making (see chart for details).  Review of the [place] CSRS was performed in accordance of the [facility] prior to dispensing any controlled drugs.      Patient's diagnosis is consistent with bacterial sinusitis.  Patient presents emergency department sinus pressure, nasal congestion, postnasal drip, cough.  Exam is consistent with bacterial sinusitis.  Differential included influenza, viral URI, bronchitis, pneumonia.  No indication for further work-up.  Influenza test negative.. Patient will be discharged home with prescriptions for Nasacort, Bromfed cough syrup, Augmentin. Patient is to follow up with primary care as needed or otherwise directed. Patient is given ED precautions to return to the ED for any worsening or new symptoms.     ____________________________________________  FINAL CLINICAL IMPRESSION(S) / ED DIAGNOSES  Final diagnoses:  Bacterial sinusitis      NEW MEDICATIONS STARTED DURING THIS VISIT:  ED Discharge Orders         Ordered    amoxicillin-clavulanate (AUGMENTIN) 875-125 MG tablet  2 times daily     04/10/18 1849    triamcinolone (NASACORT) 55 MCG/ACT AERO nasal inhaler  2 times daily     04/10/18 1849    brompheniramine-pseudoephedrine-DM 30-2-10 MG/5ML syrup  4 times daily PRN     04/10/18 1850              This chart was dictated using voice recognition software/Dragon. Despite best efforts to proofread, errors can occur which can change the meaning. Any change was purely unintentional.    Racheal Patches, PA-C 04/10/18 1852    Willy Eddy,  MD 04/10/18 1901

## 2018-06-19 DIAGNOSIS — M545 Low back pain, unspecified: Secondary | ICD-10-CM | POA: Insufficient documentation

## 2019-01-11 ENCOUNTER — Other Ambulatory Visit: Payer: Self-pay | Admitting: Family Medicine

## 2019-01-13 DIAGNOSIS — K219 Gastro-esophageal reflux disease without esophagitis: Secondary | ICD-10-CM | POA: Insufficient documentation

## 2019-01-13 DIAGNOSIS — N952 Postmenopausal atrophic vaginitis: Secondary | ICD-10-CM | POA: Insufficient documentation

## 2019-01-14 ENCOUNTER — Other Ambulatory Visit: Payer: Self-pay | Admitting: Family Medicine

## 2019-01-14 DIAGNOSIS — Z1231 Encounter for screening mammogram for malignant neoplasm of breast: Secondary | ICD-10-CM

## 2019-02-22 ENCOUNTER — Ambulatory Visit
Admission: RE | Admit: 2019-02-22 | Discharge: 2019-02-22 | Disposition: A | Discharge status: Home / Self Care | Payer: Medicare Other | Source: Ambulatory Visit | Attending: Family Medicine | Admitting: Family Medicine

## 2019-02-22 DIAGNOSIS — Z1231 Encounter for screening mammogram for malignant neoplasm of breast: Secondary | ICD-10-CM | POA: Insufficient documentation

## 2019-06-10 ENCOUNTER — Other Ambulatory Visit: Payer: Self-pay

## 2019-06-10 ENCOUNTER — Other Ambulatory Visit
Admission: RE | Admit: 2019-06-10 | Discharge: 2019-06-10 | Disposition: A | Discharge status: Home / Self Care | Payer: Medicare Other | Source: Ambulatory Visit | Attending: Internal Medicine | Admitting: Internal Medicine

## 2019-06-10 DIAGNOSIS — Z01812 Encounter for preprocedural laboratory examination: Secondary | ICD-10-CM | POA: Diagnosis present

## 2019-06-10 DIAGNOSIS — Z20822 Contact with and (suspected) exposure to covid-19: Secondary | ICD-10-CM | POA: Insufficient documentation

## 2019-06-10 LAB — SARS CORONAVIRUS 2 (TAT 6-24 HRS): SARS Coronavirus 2: NEGATIVE

## 2019-06-11 ENCOUNTER — Encounter: Payer: Self-pay | Admitting: Internal Medicine

## 2019-06-14 ENCOUNTER — Ambulatory Visit
Admission: RE | Admit: 2019-06-14 | Discharge: 2019-06-14 | Disposition: A | Discharge status: Home / Self Care | Payer: Medicare Other | Attending: Internal Medicine | Admitting: Internal Medicine

## 2019-06-14 ENCOUNTER — Encounter
Admission: RE | Disposition: A | Discharge status: Home / Self Care | Payer: Self-pay | Source: Home / Self Care | Attending: Internal Medicine

## 2019-06-14 ENCOUNTER — Other Ambulatory Visit: Payer: Self-pay

## 2019-06-14 ENCOUNTER — Encounter: Payer: Self-pay | Admitting: Internal Medicine

## 2019-06-14 ENCOUNTER — Ambulatory Visit: Payer: Medicare Other | Admitting: Anesthesiology

## 2019-06-14 DIAGNOSIS — I10 Essential (primary) hypertension: Secondary | ICD-10-CM | POA: Diagnosis not present

## 2019-06-14 DIAGNOSIS — G4730 Sleep apnea, unspecified: Secondary | ICD-10-CM | POA: Diagnosis not present

## 2019-06-14 DIAGNOSIS — Z6841 Body Mass Index (BMI) 40.0 and over, adult: Secondary | ICD-10-CM | POA: Insufficient documentation

## 2019-06-14 DIAGNOSIS — Z794 Long term (current) use of insulin: Secondary | ICD-10-CM | POA: Insufficient documentation

## 2019-06-14 DIAGNOSIS — K640 First degree hemorrhoids: Secondary | ICD-10-CM | POA: Diagnosis not present

## 2019-06-14 DIAGNOSIS — Z7982 Long term (current) use of aspirin: Secondary | ICD-10-CM | POA: Insufficient documentation

## 2019-06-14 DIAGNOSIS — E119 Type 2 diabetes mellitus without complications: Secondary | ICD-10-CM | POA: Diagnosis not present

## 2019-06-14 DIAGNOSIS — Z79899 Other long term (current) drug therapy: Secondary | ICD-10-CM | POA: Insufficient documentation

## 2019-06-14 DIAGNOSIS — H409 Unspecified glaucoma: Secondary | ICD-10-CM | POA: Insufficient documentation

## 2019-06-14 DIAGNOSIS — Z1211 Encounter for screening for malignant neoplasm of colon: Secondary | ICD-10-CM | POA: Diagnosis present

## 2019-06-14 DIAGNOSIS — K5730 Diverticulosis of large intestine without perforation or abscess without bleeding: Secondary | ICD-10-CM | POA: Diagnosis not present

## 2019-06-14 HISTORY — DX: Anemia, unspecified: D64.9

## 2019-06-14 HISTORY — DX: Unspecified glaucoma: H40.9

## 2019-06-14 HISTORY — PX: COLONOSCOPY WITH PROPOFOL: SHX5780

## 2019-06-14 HISTORY — DX: Personal history of urinary calculi: Z87.442

## 2019-06-14 HISTORY — DX: Unspecified osteoarthritis, unspecified site: M19.90

## 2019-06-14 HISTORY — DX: Sleep apnea, unspecified: G47.30

## 2019-06-14 LAB — GLUCOSE, CAPILLARY: Glucose-Capillary: 91 mg/dL (ref 70–99)

## 2019-06-14 SURGERY — COLONOSCOPY WITH PROPOFOL
Anesthesia: General

## 2019-06-14 MED ORDER — PROPOFOL 500 MG/50ML IV EMUL
INTRAVENOUS | Status: AC
  Filled 2019-06-14: qty 50

## 2019-06-14 MED ORDER — PROPOFOL 10 MG/ML IV BOLUS
INTRAVENOUS | Status: DC | PRN
  Administered 2019-06-14: 30 mg via INTRAVENOUS
  Administered 2019-06-14: 20 mg via INTRAVENOUS

## 2019-06-14 MED ORDER — FENTANYL CITRATE (PF) 100 MCG/2ML IJ SOLN
INTRAMUSCULAR | Status: DC | PRN
  Administered 2019-06-14 (×2): 50 ug via INTRAVENOUS

## 2019-06-14 MED ORDER — LIDOCAINE HCL (PF) 2 % IJ SOLN
INTRAMUSCULAR | Status: AC
  Filled 2019-06-14: qty 10

## 2019-06-14 MED ORDER — FENTANYL CITRATE (PF) 100 MCG/2ML IJ SOLN
INTRAMUSCULAR | Status: AC
  Filled 2019-06-14: qty 2

## 2019-06-14 MED ORDER — SODIUM CHLORIDE 0.9 % IV SOLN
INTRAVENOUS | Status: DC
  Administered 2019-06-14: 1000 mL via INTRAVENOUS

## 2019-06-14 MED ORDER — PROPOFOL 500 MG/50ML IV EMUL
INTRAVENOUS | Status: DC | PRN
  Administered 2019-06-14: 30 ug/kg/min via INTRAVENOUS

## 2019-06-14 MED ORDER — LIDOCAINE HCL (PF) 2 % IJ SOLN
INTRAMUSCULAR | Status: DC | PRN
  Administered 2019-06-14: 100 mg

## 2019-06-14 MED ORDER — MIDAZOLAM HCL 2 MG/2ML IJ SOLN
INTRAMUSCULAR | Status: AC
  Filled 2019-06-14: qty 2

## 2019-06-14 MED ORDER — MIDAZOLAM HCL 5 MG/5ML IJ SOLN
INTRAMUSCULAR | Status: DC | PRN
  Administered 2019-06-14: 2 mg via INTRAVENOUS

## 2019-06-14 NOTE — Interval H&P Note (Signed)
History and Physical Interval Note:  06/14/2019 11:04 AM  Hulan Amato  has presented today for surgery, with the diagnosis of screening.  The various methods of treatment have been discussed with the patient and family. After consideration of risks, benefits and other options for treatment, the patient has consented to  Procedure(s): COLONOSCOPY WITH PROPOFOL (N/A) as a surgical intervention.  The patient's history has been reviewed, patient examined, no change in status, stable for surgery.  I have reviewed the patient's chart and labs.  Questions were answered to the patient's satisfaction.     [place]ell [place], [place]

## 2019-06-14 NOTE — Anesthesia Postprocedure Evaluation (Signed)
Anesthesia Post Note  Patient: Valerie Maddox  Procedure(s) Performed: COLONOSCOPY WITH PROPOFOL (N/A )  Patient location during evaluation: PACU Anesthesia Type: General Level of consciousness: awake and alert Pain management: pain level controlled Vital Signs Assessment: post-procedure vital signs reviewed and stable Respiratory status: spontaneous breathing, nonlabored ventilation and respiratory function stable Cardiovascular status: blood pressure returned to baseline and stable Postop Assessment: no apparent nausea or vomiting Anesthetic complications: no     Last Vitals:  Vitals:   06/14/19 1142 06/14/19 1152  BP: (!) 146/70 136/65  Pulse:    Resp:    Temp:    SpO2:      Last Pain:  Vitals:   06/14/19 1152  TempSrc:   PainSc: 0-No pain                 Karleen Hampshire

## 2019-06-14 NOTE — H&P (Signed)
Outpatient short stay form Pre-procedure 06/14/2019 11:01 AM Valerie Maddox K. Alice Reichert, M.D.  Primary Physician: Juluis Pitch, M.D.  Reason for visit:  Colon cancer screening.  History of present illness:  Patient presents for colonoscopy for colon cancer screening. The patient denies complaints of abdominal pain, significant change in bowel habits, or rectal bleeding.     Current Facility-Administered Medications:  .  0.9 %  sodium chloride infusion, , Intravenous, Continuous, [place], [name], MD, Last Rate: 20 mL/hr at 06/14/19 1043, 1,000 mL at 06/14/19 1043  Medications Prior to Admission  Medication Sig Dispense Refill Last Dose  . amLODipine (NORVASC) 5 MG tablet Take 5 mg by mouth daily.   06/14/2019 at 0630  . dorzolamide-timolol (COSOPT) 22.3-6.8 MG/ML ophthalmic solution 1 drop 2 (two) times daily.   06/13/2019 at Unknown time  . gabapentin (NEURONTIN) 300 MG capsule Take 300 mg by mouth 3 (three) times daily.   06/13/2019 at Unknown time  . insulin lispro protamine-lispro (HUMALOG 75/25 MIX) (75-25) 100 UNIT/ML SUSP injection Inject 180 Units into the skin 2 (two) times daily with a meal. 180 units in the am and 66 units in the evenings as directed.   06/13/2019 at Unknown time  . lovastatin (MEVACOR) 40 MG tablet Take 40 mg by mouth at bedtime.   06/13/2019 at Unknown time  . metFORMIN (GLUCOPHAGE) 500 MG tablet Take 500 mg by mouth 2 (two) times daily with a meal.   06/13/2019 at Unknown time  . omeprazole (PRILOSEC) 40 MG capsule Take 40 mg by mouth daily.   06/13/2019 at Unknown time  . potassium chloride SA (KLOR-CON) 20 MEQ tablet Take 20 mEq by mouth 2 (two) times daily.   06/13/2019 at Unknown time  . timolol (BETIMOL) 0.5 % ophthalmic solution 1 drop 2 (two) times daily.   06/13/2019 at Unknown time  . valsartan-hydrochlorothiazide (DIOVAN-HCT) 160-12.5 MG tablet Take 1 tablet by mouth daily.   06/13/2019 at Unknown time  . amoxicillin-clavulanate (AUGMENTIN) 875-125 MG tablet Take 1  tablet by mouth 2 (two) times daily. (Patient not taking: Reported on 06/14/2019) 14 tablet 0 Completed Course at Unknown time  . aspirin 81 MG tablet Take 81 mg by mouth daily.     . brompheniramine-pseudoephedrine-DM 30-2-10 MG/5ML syrup Take 10 mLs by mouth 4 (four) times daily as needed. 200 mL 0   . etodolac (LODINE) 200 MG capsule Take 200 mg by mouth every 8 (eight) hours.   Not Taking at Unknown time  . fluticasone (FLONASE) 50 MCG/ACT nasal spray Place 1 spray into both nostrils daily.   Completed Course at Unknown time  . loratadine (CLARITIN) 10 MG tablet Take 1 tablet (10 mg total) by mouth daily. 30 tablet 2   . sulfamethoxazole-trimethoprim (BACTRIM DS,SEPTRA DS) 800-160 MG tablet Take 1 tablet by mouth 2 (two) times daily. (Patient not taking: Reported on 06/14/2019) 20 tablet 0 Completed Course at Unknown time  . traMADol (ULTRAM) 50 MG tablet Take 1 tablet (50 mg total) by mouth every 6 (six) hours as needed. 12 tablet 0   . triamcinolone (NASACORT) 55 MCG/ACT AERO nasal inhaler Place 1 spray into the nose 2 (two) times daily. 1 Inhaler 0      Allergies  Allergen Reactions  . Fluticasone     Other reaction(s): Other (See Comments) Patient states she coughs up mucus and it comes out of her nose, and has made her eye hurt.     Past Medical History:  Diagnosis Date  . Anemia   .  Arthritis    osteo  . Asthma   . Diabetes mellitus without complication (HCC)   . Glaucoma    right eye early stage.  . Hard of hearing   . History of kidney stones   . Hypertension   . Sleep apnea     Review of systems:  Otherwise negative.    Physical Exam  Gen: Alert, oriented. Appears stated age.  HEENT: [place]/AT. PERRLA. Lungs: CTA, no wheezes. CV: RR nl S1, S2. Abd: soft, benign, no masses. BS+ Ext: No edema. Pulses 2+    Planned procedures: Proceed with colonoscopy. The patient understands the nature of the planned procedure, indications, risks, alternatives and potential  complications including but not limited to bleeding, infection, perforation, damage to internal organs and possible oversedation/side effects from anesthesia. The patient agrees and gives consent to proceed.  Please refer to procedure notes for findings, recommendations and patient disposition/instructions.     [name] K. [name], M.D. Gastroenterology 06/14/2019  11:01 AM

## 2019-06-14 NOTE — Anesthesia Preprocedure Evaluation (Addendum)
Anesthesia Evaluation  Patient identified by MRN, date of birth, ID band Patient awake    Reviewed: Allergy & Precautions, H&P , NPO status , Patient's Chart, lab work & pertinent test results  Airway Mallampati: II  TM Distance: >3 FB Neck ROM: full    Dental  (+) Chipped   Pulmonary asthma , sleep apnea and Continuous Positive Airway Pressure Ventilation ,           Cardiovascular hypertension,      Neuro/Psych negative neurological ROS  negative psych ROS   GI/Hepatic negative GI ROS, Neg liver ROS,   Endo/Other  diabetesMorbid obesity  Renal/GU negative Renal ROS  negative genitourinary   Musculoskeletal   Abdominal   Peds  Hematology  (+) Blood dyscrasia, anemia ,   Anesthesia Other Findings Severe hearing loss, had to write down everything to obtain consent.  Past Medical History: No date: Anemia No date: Arthritis     Comment:  osteo No date: Asthma No date: Diabetes mellitus without complication (HCC) No date: Glaucoma     Comment:  right eye early stage. No date: Hard of hearing No date: History of kidney stones No date: Hypertension No date: Sleep apnea  Past Surgical History: 1998: ABDOMINAL HYSTERECTOMY 03/16/2009: COLONOSCOPY 2005: KNEE ARTHROSCOPY; Left 06/2019: shoulder; Right No date: TOE SURGERY; Left     Comment:  corn removal No date: TOE SURGERY; Right     Reproductive/Obstetrics negative OB ROS                           Anesthesia Physical Anesthesia Plan  ASA: III  Anesthesia Plan: General   Post-op Pain Management:    Induction:   PONV Risk Score and Plan: Propofol infusion and TIVA  Airway Management Planned: Natural Airway and Nasal Cannula  Additional Equipment:   Intra-op Plan:   Post-operative Plan:   Informed Consent: I have reviewed the patients History and Physical, chart, labs and discussed the procedure including the risks,  benefits and alternatives for the proposed anesthesia with the patient or authorized representative who has indicated his/her understanding and acceptance.     Dental Advisory Given  Plan Discussed with: Anesthesiologist  Anesthesia Plan Comments:         Anesthesia Quick Evaluation

## 2019-06-14 NOTE — Transfer of Care (Signed)
Immediate Anesthesia Transfer of Care Note  Patient: Valerie Maddox  Procedure(s) Performed: COLONOSCOPY WITH PROPOFOL (N/A )  Patient Location: PACU  Anesthesia Type:General  Level of Consciousness: sedated  Airway & Oxygen Therapy: Patient Spontanous Breathing and Patient connected to nasal cannula oxygen  Post-op Assessment: Report given to RN and Post -op Vital signs reviewed and stable  Post vital signs: Reviewed and stable  Last Vitals:  Vitals Value Taken Time  BP 128/64 06/14/19 1132  Temp    Pulse 79 06/14/19 1132  Resp 21 06/14/19 1132  SpO2 98 % 06/14/19 1132  Vitals shown include unvalidated device data.  Last Pain:  Vitals:   06/14/19 1024  TempSrc: Temporal         Complications: No apparent anesthesia complications

## 2019-06-14 NOTE — Op Note (Signed)
[facility] Gastroenterology Patient Name: Valerie Maddox Procedure Date: 06/14/2019 11:07 AM MRN: [id number] Account #: [id number] Date of Birth: November 16, 1950 Admit Type: Outpatient Age: 69 Room: [facility] ENDO ROOM 3 Gender: Female Note Status: Finalized Procedure:             Colonoscopy Indications:           Screening for colorectal malignant neoplasm Providers:             Boykin Nearing. [name] MD, MD Referring MD:          Teena Irani. Terance Hart, MD (Referring MD) Medicines:             Propofol per Anesthesia Complications:         No immediate complications. Procedure:             Pre-Anesthesia Assessment:                        - The risks and benefits of the procedure and the                         sedation options and risks were discussed with the                         patient. All questions were answered and informed                         consent was obtained.                        - Patient identification and proposed procedure were                         verified prior to the procedure by the nurse. The                         procedure was verified in the procedure room.                        - ASA Grade Assessment: III - A patient with severe                         systemic disease.                        - After reviewing the risks and benefits, the patient                         was deemed in satisfactory condition to undergo the                         procedure.                        After obtaining informed consent, the colonoscope was                         passed under direct vision. Throughout the procedure,                         the patient's blood pressure,  pulse, and oxygen                         saturations were monitored continuously. The                         Colonoscope was introduced through the anus and                         advanced to the the cecum, identified by appendiceal                         orifice and ileocecal  valve. The colonoscopy was                         performed without difficulty. The patient tolerated                         the procedure well. The quality of the bowel                         preparation was adequate. The ileocecal valve,                         appendiceal orifice, and rectum were photographed. Findings:      The perianal and digital rectal examinations were normal. Pertinent       negatives include normal sphincter tone and no palpable rectal lesions.      Many small and large-mouthed diverticula were found in the sigmoid colon.      Non-bleeding internal hemorrhoids were found during retroflexion. The       hemorrhoids were Grade I (internal hemorrhoids that do not prolapse).      The exam was otherwise without abnormality. Impression:            - Diverticulosis in the sigmoid colon.                        - Non-bleeding internal hemorrhoids.                        - The examination was otherwise normal.                        - No specimens collected. Recommendation:        - Patient has a contact number available for                         emergencies. The signs and symptoms of potential                         delayed complications were discussed with the patient.                         Return to normal activities tomorrow. Written                         discharge instructions were provided to the patient.                        -  Resume previous diet.                        - Continue present medications.                        - Repeat colonoscopy in 10 years for screening                         purposes.                        - Return to GI office PRN.                        - The findings and recommendations were discussed with                         the patient. Procedure Code(s):     --- Professional ---                        K9983, Colorectal cancer screening; colonoscopy on                         individual not meeting criteria for high  risk Diagnosis Code(s):     --- Professional ---                        K57.30, Diverticulosis of large intestine without                         perforation or abscess without bleeding                        K64.0, First degree hemorrhoids                        Z12.11, Encounter for screening for malignant neoplasm                         of colon CPT copyright 2019 [employer]. All rights reserved. The codes documented in this report are preliminary and upon coder review may  be revised to meet current compliance requirements. Stanton Kidney MD, MD 06/14/2019 11:34:34 AM This report has been signed electronically. Number of Addenda: 0 Note Initiated On: 06/14/2019 11:07 AM Scope Withdrawal Time: 0 hours 7 minutes 8 seconds  Total Procedure Duration: 0 hours 10 minutes 48 seconds  Estimated Blood Loss:  Estimated blood loss: none.      [facility]

## 2019-06-15 ENCOUNTER — Encounter: Payer: Self-pay | Admitting: *Deleted

## 2020-03-02 ENCOUNTER — Other Ambulatory Visit: Payer: Self-pay | Admitting: Family Medicine

## 2020-03-23 ENCOUNTER — Ambulatory Visit
Admission: EM | Admit: 2020-03-23 | Discharge: 2020-03-23 | Disposition: A | Discharge status: Home / Self Care | Payer: Medicare Other | Attending: Emergency Medicine | Admitting: Emergency Medicine

## 2020-03-23 ENCOUNTER — Other Ambulatory Visit: Payer: Self-pay

## 2020-03-23 DIAGNOSIS — R599 Enlarged lymph nodes, unspecified: Secondary | ICD-10-CM | POA: Diagnosis present

## 2020-03-23 LAB — CBC WITH DIFFERENTIAL/PLATELET
Abs Immature Granulocytes: 0.03 10*3/uL (ref 0.00–0.07)
Basophils Absolute: 0 10*3/uL (ref 0.0–0.1)
Basophils Relative: 1 %
Eosinophils Absolute: 0.2 10*3/uL (ref 0.0–0.5)
Eosinophils Relative: 3 %
HCT: 38.4 % (ref 36.0–46.0)
Hemoglobin: 11.6 g/dL — ABNORMAL LOW (ref 12.0–15.0)
Immature Granulocytes: 1 %
Lymphocytes Relative: 27 %
Lymphs Abs: 1.7 10*3/uL (ref 0.7–4.0)
MCH: 25.3 pg — ABNORMAL LOW (ref 26.0–34.0)
MCHC: 30.2 g/dL (ref 30.0–36.0)
MCV: 83.7 fL (ref 80.0–100.0)
Monocytes Absolute: 0.5 10*3/uL (ref 0.1–1.0)
Monocytes Relative: 8 %
Neutro Abs: 3.9 10*3/uL (ref 1.7–7.7)
Neutrophils Relative %: 60 %
Platelets: 251 10*3/uL (ref 150–400)
RBC: 4.59 MIL/uL (ref 3.87–5.11)
RDW: 14.9 % (ref 11.5–15.5)
WBC: 6.3 10*3/uL (ref 4.0–10.5)
nRBC: 0 % (ref 0.0–0.2)

## 2020-03-23 MED ORDER — TRAMADOL HCL 50 MG PO TABS: 50.0000 mg | ORAL_TABLET | Freq: Four times a day (QID) | ORAL | 0 refills | Status: AC | PRN

## 2020-03-23 NOTE — ED Triage Notes (Signed)
Pt c/o having knots in head that appeared on Sunday. Also sts there is a knot on neck. No known injury to head or neck.

## 2020-03-23 NOTE — Discharge Instructions (Addendum)
Your blood work did not reveal the presence of any infection.  There is no clear indication as to why the lymph nodes are swollen.  There is no clarification as to why you are having the pain in your scalp.  Take the tramadol every 6 hours as needed for pain control.  You can take this with Tylenol or ibuprofen.  If your symptoms continue I would follow-up with your primary care provider.

## 2020-03-23 NOTE — ED Provider Notes (Signed)
[facility]    CSN: [id number] Arrival date & time: 03/23/20  1423      History   Chief Complaint Chief Complaint  Patient presents with  . Headache    HPI Valerie Maddox is a 69 y.o. female.   HPI   69 year old female here for evaluation of painful knots on the right side of her head.  Patient reports that she developed a knot at the base of her skull, and not to the right side of her head, and behind the angle of her right jaw 4 days ago.  Patient denies fever or cold symptoms.  Patient wears a hearing aid and cannot hear what I am asking her.  Her friend is having to help interpret for her  Past Medical History:  Diagnosis Date  . Anemia   . Arthritis    osteo  . Asthma   . Diabetes mellitus without complication (HCC)   . Glaucoma    right eye early stage.  . Hard of hearing   . History of kidney stones   . Hypertension   . Sleep apnea     There are no problems to display for this patient.   Past Surgical History:  Procedure Laterality Date  . ABDOMINAL HYSTERECTOMY  1998  . COLONOSCOPY  03/16/2009  . COLONOSCOPY WITH PROPOFOL N/A 06/14/2019   Procedure: COLONOSCOPY WITH PROPOFOL;  Surgeon: Toledo, Boykin Nearing, MD;  Location: [facility] ENDOSCOPY;  Service: Gastroenterology;  Laterality: N/A;  . KNEE ARTHROSCOPY Left 2005  . shoulder Right 06/2019  . TOE SURGERY Left    corn removal  . TOE SURGERY Right     OB History   No obstetric history on file.      Home Medications    Prior to Admission medications   Medication Sig Start Date End Date Taking? Authorizing Provider  amLODipine (NORVASC) 5 MG tablet Take 5 mg by mouth daily.    [provider]  amoxicillin-clavulanate (AUGMENTIN) 875-125 MG tablet Take 1 tablet by mouth 2 (two) times daily. Patient not taking: Reported on 06/14/2019 04/10/18   Cuthriell, Delorise Royals, PA-C  aspirin 81 MG tablet Take 81 mg by mouth daily.    [provider]  brompheniramine-pseudoephedrine-DM  30-2-10 MG/5ML syrup Take 10 mLs by mouth 4 (four) times daily as needed. 04/10/18   Cuthriell, Delorise Royals, PA-C  dorzolamide-timolol (COSOPT) 22.3-6.8 MG/ML ophthalmic solution 1 drop 2 (two) times daily.    [provider]  etodolac (LODINE) 200 MG capsule Take 200 mg by mouth every 8 (eight) hours.    [provider]  fluticasone (FLONASE) 50 MCG/ACT nasal spray Place 1 spray into both nostrils daily.    [provider]  gabapentin (NEURONTIN) 300 MG capsule Take 300 mg by mouth 3 (three) times daily.    [provider]  insulin lispro protamine-lispro (HUMALOG 75/25 MIX) (75-25) 100 UNIT/ML SUSP injection Inject 180 Units into the skin 2 (two) times daily with a meal. 180 units in the am and 66 units in the evenings as directed.    [provider]  loratadine (CLARITIN) 10 MG tablet Take 1 tablet (10 mg total) by mouth daily. 02/26/15 02/26/16  Tommi Rumps, PA-C  lovastatin (MEVACOR) 40 MG tablet Take 40 mg by mouth at bedtime.    [provider]  metFORMIN (GLUCOPHAGE) 500 MG tablet Take 500 mg by mouth 2 (two) times daily with a meal.    [provider]  omeprazole (PRILOSEC) 40 MG  capsule Take 40 mg by mouth daily.    [provider]  potassium chloride SA (KLOR-CON) 20 MEQ tablet Take 20 mEq by mouth 2 (two) times daily.    [provider]  sulfamethoxazole-trimethoprim (BACTRIM DS,SEPTRA DS) 800-160 MG tablet Take 1 tablet by mouth 2 (two) times daily. Patient not taking: Reported on 06/14/2019 10/09/16   Bridget Hartshorn L, PA-C  timolol (BETIMOL) 0.5 % ophthalmic solution 1 drop 2 (two) times daily.    [provider]  traMADol (ULTRAM) 50 MG tablet Take 1 tablet (50 mg total) by mouth every 6 (six) hours as needed. 03/23/20   Becky Augusta, NP  triamcinolone (NASACORT) 55 MCG/ACT AERO nasal inhaler Place 1 spray into the nose 2 (two) times daily. 04/10/18 04/10/19  Cuthriell, Delorise Royals, PA-C   valsartan-hydrochlorothiazide (DIOVAN-HCT) 160-12.5 MG tablet Take 1 tablet by mouth daily.    [provider]    Family History Family History  Problem Relation Age of Onset  . Breast cancer Sister   . Breast cancer Sister     Social History Social History   Tobacco Use  . Smoking status: Never Smoker  . Smokeless tobacco: Never Used  Vaping Use  . Vaping Use: Never used  Substance Use Topics  . Alcohol use: No  . Drug use: Never     Allergies   Fluticasone   Review of Systems Review of Systems  Constitutional: Negative for activity change, appetite change and fever.  Musculoskeletal: Positive for myalgias and neck pain.  Skin: Positive for color change. Negative for rash and wound.  Neurological: Negative for dizziness and headaches.  Hematological: Positive for adenopathy.  Psychiatric/Behavioral: Negative.      Physical Exam Triage Vital Signs ED Triage Vitals  Enc Vitals Group     BP 03/23/20 1439 110/88     Pulse Rate 03/23/20 1439 92     Resp 03/23/20 1439 18     Temp 03/23/20 1439 99 F (37.2 C)     Temp Source 03/23/20 1439 Oral     SpO2 03/23/20 1439 96 %     Weight 03/23/20 1440 264 lb (119.7 kg)     Height 03/23/20 1440 5\' 6"  (1.676 m)     Head Circumference --      Peak Flow --      Pain Score 03/23/20 1439 10     Pain Loc --      Pain Edu? --      Excl. in GC? --    No data found.  Updated Vital Signs BP 110/88   Pulse 92   Temp 99 F (37.2 C) (Oral)   Resp 18   Ht 5\' 6"  (1.676 m)   Wt 264 lb (119.7 kg)   SpO2 96%   BMI 42.61 kg/m   Visual Acuity Right Eye Distance:   Left Eye Distance:   Bilateral Distance:    Right Eye Near:   Left Eye Near:    Bilateral Near:     Physical Exam Vitals and nursing note reviewed.  Constitutional:      General: She is not in acute distress.    Appearance: She is well-developed. She is not toxic-appearing.  HENT:     Head: Normocephalic and atraumatic.     Mouth/Throat:      Mouth: Mucous membranes are moist.     Pharynx: Oropharynx is clear.  Cardiovascular:     Rate and Rhythm: Normal rate and regular rhythm.     Heart sounds:  Normal heart sounds. No murmur heard. No gallop.   Pulmonary:     Effort: Pulmonary effort is normal.     Breath sounds: Normal breath sounds. No wheezing.  Musculoskeletal:     Cervical back: Normal range of motion and neck supple.  Lymphadenopathy:     Cervical: Cervical adenopathy present.  Skin:    General: Skin is warm.     Capillary Refill: Capillary refill takes less than 2 seconds.     Findings: Erythema present.  Neurological:     Mental Status: She is alert and oriented to person, place, and time.     GCS: GCS eye subscore is 4. GCS motor subscore is 6.  Psychiatric:        Mood and Affect: Mood normal.        Speech: Speech normal.        Behavior: Behavior normal.      UC Treatments / Results  Labs (all labs ordered are listed, but only abnormal results are displayed) Labs Reviewed  CBC WITH DIFFERENTIAL/PLATELET - Abnormal; Notable for the following components:      Result Value   Hemoglobin 11.6 (*)    MCH 25.3 (*)    All other components within normal limits    EKG   Radiology No results found.  Procedures Procedures (including critical care time)  Medications Ordered in UC Medications - No data to display  Initial Impression / Assessment and Plan / UC Course  I have reviewed the triage vital signs and the nursing notes.  Pertinent labs & imaging results that were available during my care of the patient were reviewed by me and considered in my medical decision making (see chart for details).   Patient is here for evaluation of a knot that developed on the back right side of her neck 4 days ago.  Patient has since been complaining of knots and pain on the right side of her head only.  On exam there is a tender, freely mobile, palpable lymph node at the right base of her skull and at the  right angle of her jaw.  Patient has tenderness at other locations through her scalp but they are free of redness, swelling, discharge.  Patient has not had a fever or had any associated cold symptoms.  CBC does not indicate infection.  White count is 6.3 with no abnormalities in the differential.  We will discharge patient home with diagnosis of reactive lymphadenopathy.  Give tramadol to help with pain.  And have patient follow-up with her primary care provider.  Final Clinical Impressions(s) / UC Diagnoses   Final diagnoses:  Reactive lymphadenopathy     Discharge Instructions     Your blood work did not reveal the presence of any infection.  There is no clear indication as to why the lymph nodes are swollen.  There is no clarification as to why you are having the pain in your scalp.  Take the tramadol every 6 hours as needed for pain control.  You can take this with Tylenol or ibuprofen.  If your symptoms continue I would follow-up with your primary care provider.    ED Prescriptions    Medication Sig Dispense Auth. Provider   traMADol (ULTRAM) 50 MG tablet Take 1 tablet (50 mg total) by mouth every 6 (six) hours as needed. 15 tablet Becky Augusta, NP     I have reviewed the PDMP during this encounter.   Becky Augusta, NP 03/23/20 1537

## 2020-04-05 ENCOUNTER — Other Ambulatory Visit: Payer: Self-pay | Admitting: Family Medicine

## 2020-04-05 DIAGNOSIS — Z1231 Encounter for screening mammogram for malignant neoplasm of breast: Secondary | ICD-10-CM

## 2020-05-29 ENCOUNTER — Other Ambulatory Visit: Payer: Self-pay

## 2020-05-29 ENCOUNTER — Ambulatory Visit
Admission: RE | Admit: 2020-05-29 | Discharge: 2020-05-29 | Disposition: A | Discharge status: Home / Self Care | Payer: Medicare Other | Source: Ambulatory Visit | Attending: Family Medicine | Admitting: Family Medicine

## 2020-05-29 DIAGNOSIS — Z1231 Encounter for screening mammogram for malignant neoplasm of breast: Secondary | ICD-10-CM | POA: Insufficient documentation

## 2021-06-29 ENCOUNTER — Other Ambulatory Visit: Payer: Self-pay | Admitting: Family Medicine

## 2021-06-29 DIAGNOSIS — E079 Disorder of thyroid, unspecified: Secondary | ICD-10-CM

## 2021-06-29 DIAGNOSIS — N83201 Unspecified ovarian cyst, right side: Secondary | ICD-10-CM

## 2021-07-06 ENCOUNTER — Ambulatory Visit
Admission: RE | Admit: 2021-07-06 | Discharge: 2021-07-06 | Disposition: A | Discharge status: Home / Self Care | Payer: Medicare Other | Source: Ambulatory Visit | Attending: Family Medicine | Admitting: Family Medicine

## 2021-07-06 ENCOUNTER — Other Ambulatory Visit: Payer: Self-pay | Admitting: Family Medicine

## 2021-07-06 ENCOUNTER — Other Ambulatory Visit: Payer: Self-pay

## 2021-07-06 DIAGNOSIS — E079 Disorder of thyroid, unspecified: Secondary | ICD-10-CM | POA: Diagnosis present

## 2021-07-06 DIAGNOSIS — N83201 Unspecified ovarian cyst, right side: Secondary | ICD-10-CM

## 2021-10-20 ENCOUNTER — Emergency Department
Admission: EM | Admit: 2021-10-20 | Discharge: 2021-10-20 | Disposition: A | Discharge status: Home / Self Care | Payer: Medicare Other | Attending: Emergency Medicine | Admitting: Emergency Medicine

## 2021-10-20 ENCOUNTER — Encounter: Payer: Self-pay | Admitting: Emergency Medicine

## 2021-10-20 ENCOUNTER — Other Ambulatory Visit: Payer: Self-pay

## 2021-10-20 DIAGNOSIS — S70362A Insect bite (nonvenomous), left thigh, initial encounter: Secondary | ICD-10-CM | POA: Insufficient documentation

## 2021-10-20 DIAGNOSIS — J3489 Other specified disorders of nose and nasal sinuses: Secondary | ICD-10-CM | POA: Insufficient documentation

## 2021-10-20 DIAGNOSIS — J45909 Unspecified asthma, uncomplicated: Secondary | ICD-10-CM | POA: Insufficient documentation

## 2021-10-20 DIAGNOSIS — I10 Essential (primary) hypertension: Secondary | ICD-10-CM | POA: Insufficient documentation

## 2021-10-20 DIAGNOSIS — E119 Type 2 diabetes mellitus without complications: Secondary | ICD-10-CM | POA: Diagnosis not present

## 2021-10-20 DIAGNOSIS — W57XXXA Bitten or stung by nonvenomous insect and other nonvenomous arthropods, initial encounter: Secondary | ICD-10-CM | POA: Diagnosis not present

## 2021-10-20 DIAGNOSIS — S70361A Insect bite (nonvenomous), right thigh, initial encounter: Secondary | ICD-10-CM | POA: Insufficient documentation

## 2021-10-20 DIAGNOSIS — R0989 Other specified symptoms and signs involving the circulatory and respiratory systems: Secondary | ICD-10-CM

## 2021-10-20 DIAGNOSIS — N9489 Other specified conditions associated with female genital organs and menstrual cycle: Secondary | ICD-10-CM | POA: Insufficient documentation

## 2021-10-20 LAB — CBC
HCT: 35.8 % — ABNORMAL LOW (ref 36.0–46.0)
Hemoglobin: 11.2 g/dL — ABNORMAL LOW (ref 12.0–15.0)
MCH: 25.9 pg — ABNORMAL LOW (ref 26.0–34.0)
MCHC: 31.3 g/dL (ref 30.0–36.0)
MCV: 82.7 fL (ref 80.0–100.0)
Platelets: 265 10*3/uL (ref 150–400)
RBC: 4.33 MIL/uL (ref 3.87–5.11)
RDW: 13.9 % (ref 11.5–15.5)
WBC: 7.7 10*3/uL (ref 4.0–10.5)
nRBC: 0 % (ref 0.0–0.2)

## 2021-10-20 LAB — BASIC METABOLIC PANEL
Anion gap: 10 (ref 5–15)
BUN: 17 mg/dL (ref 8–23)
CO2: 23 mmol/L (ref 22–32)
Calcium: 10.5 mg/dL — ABNORMAL HIGH (ref 8.9–10.3)
Chloride: 105 mmol/L (ref 98–111)
Creatinine, Ser: 1.26 mg/dL — ABNORMAL HIGH (ref 0.44–1.00)
GFR, Estimated: 46 mL/min — ABNORMAL LOW (ref >60)
Glucose, Bld: 151 mg/dL — ABNORMAL HIGH (ref 70–99)
Potassium: 3.8 mmol/L (ref 3.5–5.1)
Sodium: 138 mmol/L (ref 135–145)

## 2021-10-20 LAB — CBG MONITORING, ED: Glucose-Capillary: 151 mg/dL — ABNORMAL HIGH (ref 70–99)

## 2021-10-20 NOTE — ED Provider Notes (Signed)
[facility] Provider Note    Event Date/Time   First MD Initiated Contact with Patient 10/20/21 1555     (approximate)   History   Chief Complaint: Weakness   HPI  Valerie Maddox is a 71 y.o. female with a history of asthma hypertension diabetes and obesity who comes to the ED complaining of mosquito bites to bilateral thighs that happened 4 days ago at a July 4 cookout.  She is otherwise feeling fine, eating normally.  No other symptoms except for itching in those areas.  Denies fever or increasing pain or swelling.  Yesterday she also had some clear rhinorrhea as well as a mild frontal headache.  That is resolved.  No other complaints.  She has been taking her medicine.     Physical Exam   Triage Vital Signs: ED Triage Vitals  Enc Vitals Group     BP 10/20/21 1532 131/61     Pulse Rate 10/20/21 1532 92     Resp 10/20/21 1532 16     Temp 10/20/21 1532 98.6 F (37 C)     Temp Source 10/20/21 1532 Oral     SpO2 10/20/21 1532 98 %     Weight 10/20/21 1533 220 lb 7.4 oz (100 kg)     Height 10/20/21 1533 5\' 5"  (1.651 m)     Head Circumference --      Peak Flow --      Pain Score 10/20/21 1533 0     Pain Loc --      Pain Edu? --      Excl. in GC? --     Most recent vital signs: Vitals:   10/20/21 1532  BP: 131/61  Pulse: 92  Resp: 16  Temp: 98.6 F (37 C)  SpO2: 98%    General: Awake, no distress.  CV:  Good peripheral perfusion.  Regular rate rhythm Resp:  Normal effort.  Clear to auscultation bilaterally Abd:  No distention.  Soft nontender Other:  Moist mucous membranes.  Nose looks normal without inflammation or congestion or discharge.  Extraocular movements intact and painless.  Skin exam reveals about 10 small papular red lesions scattered around bilateral thighs without any surrounding inflammatory changes.  No purulence or drainage, no bullae.  No petechiae.  Consistent with insect bite.   ED Results / Procedures /  Treatments   Labs (all labs ordered are listed, but only abnormal results are displayed) Labs Reviewed  BASIC METABOLIC PANEL - Abnormal; Notable for the following components:      Result Value   Glucose, Bld 151 (*)    Creatinine, Ser 1.26 (*)    Calcium 10.5 (*)    GFR, Estimated 46 (*)    All other components within normal limits  CBC - Abnormal; Notable for the following components:   Hemoglobin 11.2 (*)    HCT 35.8 (*)    MCH 25.9 (*)    All other components within normal limits  CBG MONITORING, ED - Abnormal; Notable for the following components:   Glucose-Capillary 151 (*)    All other components within normal limits  URINALYSIS, ROUTINE W REFLEX MICROSCOPIC     EKG    RADIOLOGY    PROCEDURES:  Procedures   MEDICATIONS ORDERED IN ED: Medications - No data to display   IMPRESSION / MDM / ASSESSMENT AND PLAN / ED COURSE  I reviewed the triage vital signs and the nursing notes.  Differential diagnosis includes, but is not limited to, dehydration, electrolyte abnormality, AKI, insect bite, viral illness, seasonal allergies  Patient's presentation is most consistent with acute complicated illness / injury requiring diagnostic workup.  Patient presents with complaint of insect bites that are itchy on both thighs as well as some URI type symptoms.  Due to her age, comorbidities, lab work-up is needed which is reassuring.  She appears adequately hydrated though creatinine is slightly elevated from baseline from 3 years ago, and I think this is likely related to CKD due to normal BUN level.  No signs of any inflammatory or autoimmune or vasculitis condition.  Not think she has pneumonia.  Vital signs are normal and exam is benign and she is suitable for outpatient management.       FINAL CLINICAL IMPRESSION(S) / ED DIAGNOSES   Final diagnoses:  Insect bite of thigh, unspecified laterality, initial encounter  Runny nose     Rx  / DC Orders   ED Discharge Orders     None        Note:  This document was prepared using Dragon voice recognition software and may include unintentional dictation errors.   Sharman Cheek, MD 10/20/21 [phone]

## 2021-10-20 NOTE — ED Triage Notes (Signed)
Pt presents to ER ambulatory to traige room, reports she got some insect bites only on her legs and started to have nasal congestion today and feeling weak. Pt denies any other symptoms at present.

## 2021-10-20 NOTE — Discharge Instructions (Signed)
Your evaluation in the ER today is reassuring.  Keep taking all of your usual medications as prescribed.

## 2021-11-08 ENCOUNTER — Other Ambulatory Visit: Payer: Self-pay | Admitting: Family Medicine

## 2021-11-08 DIAGNOSIS — N644 Mastodynia: Secondary | ICD-10-CM

## 2021-11-23 ENCOUNTER — Other Ambulatory Visit: Payer: Medicare Other

## 2021-12-03 ENCOUNTER — Ambulatory Visit
Admission: RE | Admit: 2021-12-03 | Discharge: 2021-12-03 | Disposition: A | Discharge status: Home / Self Care | Payer: Medicare Other | Source: Ambulatory Visit | Attending: Family Medicine | Admitting: Family Medicine

## 2021-12-03 DIAGNOSIS — N644 Mastodynia: Secondary | ICD-10-CM | POA: Insufficient documentation

## 2021-12-19 DIAGNOSIS — N83201 Unspecified ovarian cyst, right side: Secondary | ICD-10-CM | POA: Insufficient documentation

## 2022-05-14 ENCOUNTER — Emergency Department
Admission: EM | Admit: 2022-05-14 | Discharge: 2022-05-14 | Disposition: A | Discharge status: Home / Self Care | Payer: 59 | Attending: Emergency Medicine | Admitting: Emergency Medicine

## 2022-05-14 ENCOUNTER — Emergency Department: Payer: 59

## 2022-05-14 ENCOUNTER — Other Ambulatory Visit: Payer: Self-pay

## 2022-05-14 DIAGNOSIS — M7989 Other specified soft tissue disorders: Secondary | ICD-10-CM | POA: Insufficient documentation

## 2022-05-14 DIAGNOSIS — M25471 Effusion, right ankle: Secondary | ICD-10-CM | POA: Insufficient documentation

## 2022-05-14 DIAGNOSIS — M79671 Pain in right foot: Secondary | ICD-10-CM | POA: Insufficient documentation

## 2022-05-14 NOTE — ED Provider Notes (Signed)
   [facility] Provider Note    Event Date/Time   First MD Initiated Contact with Patient 05/14/22 [phone]     (approximate)   History   Foot pain   HPI  Valerie Maddox is a 72 y.o. female who presents with complaints of right heel pain, patient reports that is been hurting for about 2 weeks, she reports that in the past she has required surgery on this.  Review of records performed, I am unable to see records of the surgery     Physical Exam   Triage Vital Signs: ED Triage Vitals  Enc Vitals Group     BP 05/14/22 0742 (!) 136/56     Pulse Rate 05/14/22 0742 85     Resp 05/14/22 0742 18     Temp 05/14/22 0742 98.8 F (37.1 C)     Temp Source 05/14/22 0742 Oral     SpO2 05/14/22 0742 96 %     Weight 05/14/22 0744 111.6 kg (246 lb)     Height 05/14/22 0744 1.651 m (5\' 5" )     Head Circumference --      Peak Flow --      Pain Score 05/14/22 0743 8     Pain Loc --      Pain Edu? --      Excl. in [place]? --     Most recent vital signs: Vitals:   05/14/22 0742  BP: (!) 136/56  Pulse: 85  Resp: 18  Temp: 98.8 F (37.1 C)  SpO2: 96%     General: Awake, no distress.  CV:  Good peripheral perfusion.  Resp:  Normal effort.  Abd:  No distention.  Other:  Right foot, mild right ankle swelling, no significant heel tenderness palpated, mild tenderness along the lateral aspect of the ankle, warm and well-perfused   ED Results / Procedures / Treatments   Labs (all labs ordered are listed, but only abnormal results are displayed) Labs Reviewed - No data to display   EKG     RADIOLOGY X-ray viewed interpret by me, no fracture    PROCEDURES:  Critical Care performed:   Procedures   MEDICATIONS ORDERED IN ED: Medications - No data to display   IMPRESSION / MDM / [place] / ED COURSE  I reviewed the triage vital signs and the nursing notes. Patient's presentation is most consistent with acute complicated illness / injury  requiring diagnostic workup.  Patient presents with foot/ankle pain.  Mild swelling and tenderness along the lateral malleolus, will send for x-ray  X-ray negative for bony abnormality, referral to podiatry, supportive care        FINAL CLINICAL IMPRESSION(S) / ED DIAGNOSES   Final diagnoses:  Foot pain, right     Rx / DC Orders   ED Discharge Orders     None        Note:  This document was prepared using Dragon voice recognition software and may include unintentional dictation errors.   Lavonia Drafts, MD 05/14/22 1034

## 2022-05-14 NOTE — ED Notes (Signed)
Pt. States she has been experiencing pain in her right foot and ankle over a week and pain in her throat when swallowing for a while. Right foot and ankle pain rated 10 on a scale of 0-10. Pain in throat rated 8 on the scale of 0-10. Pt states prior endoscopy at [facility] revealed a knot that the provider did not identify but told her not to worry about. Communication was completed by writing question and pt stating response. Kendal Hymen, [employer] SN

## 2022-05-14 NOTE — ED Triage Notes (Signed)
Pt states she has mouth pain when she eats and pain in her R foot that has been there "a while"- pt is hearing impaired

## 2022-09-09 ENCOUNTER — Encounter: Payer: Self-pay | Admitting: Intensive Care

## 2022-09-09 ENCOUNTER — Other Ambulatory Visit: Payer: Self-pay

## 2022-09-09 ENCOUNTER — Emergency Department: Payer: 59

## 2022-09-09 ENCOUNTER — Emergency Department
Admission: EM | Admit: 2022-09-09 | Discharge: 2022-09-09 | Disposition: A | Discharge status: Home / Self Care | Payer: 59 | Attending: Emergency Medicine | Admitting: Emergency Medicine

## 2022-09-09 DIAGNOSIS — I10 Essential (primary) hypertension: Secondary | ICD-10-CM | POA: Diagnosis not present

## 2022-09-09 DIAGNOSIS — M25551 Pain in right hip: Secondary | ICD-10-CM | POA: Diagnosis present

## 2022-09-09 DIAGNOSIS — N3000 Acute cystitis without hematuria: Secondary | ICD-10-CM | POA: Diagnosis not present

## 2022-09-09 LAB — CBC WITH DIFFERENTIAL/PLATELET
Abs Immature Granulocytes: 0.04 10*3/uL (ref 0.00–0.07)
Basophils Absolute: 0 10*3/uL (ref 0.0–0.1)
Basophils Relative: 1 %
Eosinophils Absolute: 0.2 10*3/uL (ref 0.0–0.5)
Eosinophils Relative: 2 %
HCT: 37.1 % (ref 36.0–46.0)
Hemoglobin: 11.4 g/dL — ABNORMAL LOW (ref 12.0–15.0)
Immature Granulocytes: 1 %
Lymphocytes Relative: 38 %
Lymphs Abs: 3.2 10*3/uL (ref 0.7–4.0)
MCH: 26.2 pg (ref 26.0–34.0)
MCHC: 30.7 g/dL (ref 30.0–36.0)
MCV: 85.3 fL (ref 80.0–100.0)
Monocytes Absolute: 0.6 10*3/uL (ref 0.1–1.0)
Monocytes Relative: 8 %
Neutro Abs: 4.2 10*3/uL (ref 1.7–7.7)
Neutrophils Relative %: 50 %
Platelets: 260 10*3/uL (ref 150–400)
RBC: 4.35 MIL/uL (ref 3.87–5.11)
RDW: 14.6 % (ref 11.5–15.5)
WBC: 8.2 10*3/uL (ref 4.0–10.5)
nRBC: 0 % (ref 0.0–0.2)

## 2022-09-09 LAB — URINALYSIS, ROUTINE W REFLEX MICROSCOPIC
Bilirubin Urine: NEGATIVE
Glucose, UA: NEGATIVE mg/dL
Hgb urine dipstick: NEGATIVE
Ketones, ur: NEGATIVE mg/dL
Nitrite: POSITIVE — AB
Protein, ur: NEGATIVE mg/dL
Specific Gravity, Urine: 1.017 (ref 1.005–1.030)
WBC, UA: >50 WBC/hpf (ref 0–5)
pH: 5 (ref 5.0–8.0)

## 2022-09-09 LAB — BASIC METABOLIC PANEL
Anion gap: 10 (ref 5–15)
BUN: 19 mg/dL (ref 8–23)
CO2: 24 mmol/L (ref 22–32)
Calcium: 10.4 mg/dL — ABNORMAL HIGH (ref 8.9–10.3)
Chloride: 106 mmol/L (ref 98–111)
Creatinine, Ser: 0.98 mg/dL (ref 0.44–1.00)
GFR, Estimated: >60 mL/min (ref >60)
Glucose, Bld: 43 mg/dL — CL (ref 70–99)
Potassium: 3.9 mmol/L (ref 3.5–5.1)
Sodium: 140 mmol/L (ref 135–145)

## 2022-09-09 LAB — CBG MONITORING, ED: Glucose-Capillary: 82 mg/dL (ref 70–99)

## 2022-09-09 MED ORDER — LIDOCAINE 5 % EX PTCH
1.0000 | MEDICATED_PATCH | CUTANEOUS | Status: DC
  Administered 2022-09-09: 1 via TRANSDERMAL
  Filled 2022-09-09: qty 1

## 2022-09-09 MED ORDER — DEXTROSE 50 % IV SOLN
1.0000 | Freq: Once | INTRAVENOUS | Status: DC
  Filled 2022-09-09: qty 50

## 2022-09-09 MED ORDER — LIDOCAINE 5 % EX PTCH: 1.0000 | MEDICATED_PATCH | Freq: Two times a day (BID) | CUTANEOUS | 0 refills | Status: AC

## 2022-09-09 MED ORDER — ACETAMINOPHEN 325 MG PO TABS
650.0000 mg | ORAL_TABLET | Freq: Once | ORAL | Status: AC
  Administered 2022-09-09: 650 mg via ORAL
  Filled 2022-09-09: qty 2

## 2022-09-09 MED ORDER — CEFDINIR 300 MG PO CAPS: 300.0000 mg | ORAL_CAPSULE | Freq: Two times a day (BID) | ORAL | 0 refills | Status: AC

## 2022-09-09 NOTE — ED Provider Notes (Signed)
[facility] Provider Note    Event Date/Time   First MD Initiated Contact with Patient 09/09/22 1350     (approximate)   History   Hip Pain   HPI  Valerie Maddox is a 72 y.o. female with a past medical history of type 2 diabetes, sleep apnea, hyperlipidemia, fibroids, hypertension who presents today for evaluation of right hip pain.  Patient noticed this when she went to get out of the car, and felt a sharp pain on the lateral aspect of her right hip.  She reports that she is still been able to ambulate, however the pain has persisted.  She notices the pain most when she pushes on the area.  She also reports that she has pain in her low back.  She has not had any burning with urination, abdominal pain, weakness in her extremities, or paresthesias.  No chest pain or shortness of breath.  She has noticed swelling in both of her lower extremities, though this has been ongoing for the past several months since she has seen her primary care provider for this.  No fevers or chills.  There are no problems to display for this patient.         Physical Exam   Triage Vital Signs: ED Triage Vitals  Enc Vitals Group     BP 09/09/22 1329 134/65     Pulse Rate 09/09/22 1329 91     Resp 09/09/22 1329 20     Temp 09/09/22 1329 98.8 F (37.1 C)     Temp Source 09/09/22 1329 Oral     SpO2 09/09/22 1329 95 %     Weight 09/09/22 1329 234 lb (106.1 kg)     Height 09/09/22 1329 5\' 5"  (1.651 m)     Head Circumference --      Peak Flow --      Pain Score 09/09/22 1337 9     Pain Loc --      Pain Edu? --      Excl. in GC? --     Most recent vital signs: Vitals:   09/09/22 1329 09/09/22 1643  BP: 134/65 127/66  Pulse: 91 81  Resp: 20 20  Temp: 98.8 F (37.1 C) 98.4 F (36.9 C)  SpO2: 95% 96%    Physical Exam Vitals and nursing note reviewed.  Constitutional:      General: Awake and alert. No acute distress.    Appearance: Normal appearance. The patient is  obese.  HENT:     Head: Normocephalic and atraumatic.     Mouth: Mucous membranes are moist.  Eyes:     General: PERRL. Normal EOMs        Right eye: No discharge.        Left eye: No discharge.     Conjunctiva/sclera: Conjunctivae normal.  Cardiovascular:     Rate and Rhythm: Normal rate and regular rhythm.     Pulses: Normal pulses.  Pulmonary:     Effort: Pulmonary effort is normal. No respiratory distress.     Breath sounds: Normal breath sounds.  Abdominal:     Abdomen is soft. There is no abdominal tenderness. No rebound or guarding. No distention. No CVAT Pelvis stable.  Able to internally and externally rotate bilateral hips against resistance.  Able to flex and extend bilateral hips against resistance.  Strength equal bilaterally.  Sensation intact to light touch throughout.  She has tenderness over her right greater trochanter without overlying skin changes.  Back: No midline tenderness.  Right lumbar paraspinal muscle tenderness.  Strength and sensation 5/5 to bilateral lower extremities. Normal great toe extension against resistance. Normal sensation throughout feet. Normal patellar reflexes. Negative SLR and opposite SLR bilaterally.  Musculoskeletal:        General: No swelling. Normal range of motion.     Cervical back: Normal range of motion and neck supple.  Skin:    General: Skin is warm and dry.     Capillary Refill: Capillary refill takes less than 2 seconds.     Findings: No rash.  Neurological:     Mental Status: The patient is awake and alert.      ED Results / Procedures / Treatments   Labs (all labs ordered are listed, but only abnormal results are displayed) Labs Reviewed  URINALYSIS, ROUTINE W REFLEX MICROSCOPIC - Abnormal; Notable for the following components:      Result Value   Color, Urine YELLOW (*)    APPearance HAZY (*)    Nitrite POSITIVE (*)    Leukocytes,Ua MODERATE (*)    Bacteria, UA MANY (*)    All other components within normal limits   BASIC METABOLIC PANEL - Abnormal; Notable for the following components:   Glucose, Bld 43 (*)    Calcium 10.4 (*)    All other components within normal limits  CBC WITH DIFFERENTIAL/PLATELET - Abnormal; Notable for the following components:   Hemoglobin 11.4 (*)    All other components within normal limits  CBG MONITORING, ED     EKG     RADIOLOGY I independently reviewed and interpreted imaging and agree with radiologists findings.     PROCEDURES:  Critical Care performed:   Procedures   MEDICATIONS ORDERED IN ED: Medications  lidocaine (LIDODERM) 5 % 1 patch (1 patch Transdermal Patch Applied 09/09/22 1448)  acetaminophen (TYLENOL) tablet 650 mg (650 mg Oral Given 09/09/22 1448)     IMPRESSION / MDM / ASSESSMENT AND PLAN / ED COURSE  I reviewed the triage vital signs and the nursing notes.   Differential diagnosis includes, but is not limited to, greater trochanteric bursitis, labral tear, lumbar strain, less likely fracture or dislocation, urinary tract infection, pyelonephritis.  Patient is awake and alert, hemodynamically stable and afebrile.  She is nontoxic in appearance.  She has full active and passive range of motion of her hips bilaterally, though she is tenderness to palpation over her greater trochanter on her affected side.  She was given Tylenol and a Lidoderm patch with improvement of her symptoms.  Labs obtained are overall reassuring with the exception of hypoglycemia to 43.  She remained awake and alert, though she admits to taking her insulin prior to coming into the emergency department and did not eat anything.  She was given crackers and juice and her blood sugar improved substantially.  She was found to have a urinary tract infection as well, with started on antibiotics.  She has no CVA tenderness, fever, hemodynamic instability to suggest pyelonephritis.  Her hip pain improved with the Tylenol and a Lidoderm patch.  This was prescribed for her as  well.   I discussed with her the importance of close monitoring of her blood sugar at home.  We discussed return precautions and outpatient follow-up.  Patient understands and agrees with plan.  She was discharged in stable condition with her family member.  She is ambulatory with a steady gait.  Patient's presentation is most consistent with acute complicated illness / injury requiring  diagnostic workup.   Clinical Course as of 09/09/22 1707  Mon Sep 09, 2022  1531 Glucose(!!): 43 Patient drinking juice and will give D50 [JP]  1555 Patient reports that she took her insulin before eating [JP]    Clinical Course User Index [JP] Naelle Diegel, Herb Grays, PA-C     FINAL CLINICAL IMPRESSION(S) / ED DIAGNOSES   Final diagnoses:  Right hip pain  Acute cystitis without hematuria     Rx / DC Orders   ED Discharge Orders          Ordered    cefdinir (OMNICEF) 300 MG capsule  2 times daily        09/09/22 1631    lidocaine (LIDODERM) 5 %  Every 12 hours        09/09/22 1631             Note:  This document was prepared using Dragon voice recognition software and may include unintentional dictation errors.   Keturah Shavers 09/09/22 1707    Georga Hacking, MD 09/09/22 2023

## 2022-09-09 NOTE — ED Triage Notes (Signed)
Patient c/o right side pain. Reports pain started after climbing out of jeep

## 2022-09-09 NOTE — ED Notes (Signed)
Pt provided discharge instructions and prescription information. Pt was given the opportunity to ask questions and questions were answered.   

## 2022-09-09 NOTE — Discharge Instructions (Addendum)
Please take the antibiotics for your urinary tract infection.  Your x-ray was normal.  You were found to be hypoglycemic, therefore you are given juice and crackers.  Please pay close attention to your blood sugar at home.  Please return for any new, worsening, or change in symptoms or other concerns.

## 2022-09-23 ENCOUNTER — Ambulatory Visit (INDEPENDENT_AMBULATORY_CARE_PROVIDER_SITE_OTHER): Payer: 59 | Admitting: Podiatry

## 2022-09-23 ENCOUNTER — Encounter: Payer: Self-pay | Admitting: Podiatry

## 2022-09-23 DIAGNOSIS — M722 Plantar fascial fibromatosis: Secondary | ICD-10-CM | POA: Diagnosis not present

## 2022-09-23 DIAGNOSIS — R600 Localized edema: Secondary | ICD-10-CM | POA: Diagnosis not present

## 2022-09-23 NOTE — Patient Instructions (Addendum)
The swelling in your legs and feet is caused by numerous issues: blood pressure, vein function, sodium intake, kidney and heart function. Please discuss this more with your PCP    Plantar Fasciitis (Heel Spur Syndrome) with Rehab The plantar fascia is a fibrous, ligament-like, soft-tissue structure that spans the bottom of the foot. Plantar fasciitis is a condition that causes pain in the foot due to inflammation of the tissue. SYMPTOMS  Pain and tenderness on the underneath side of the foot. Pain that worsens with standing or walking. CAUSES  Plantar fasciitis is caused by irritation and injury to the plantar fascia on the underneath side of the foot. Common mechanisms of injury include: Direct trauma to bottom of the foot. Damage to a small nerve that runs under the foot where the main fascia attaches to the heel bone. Stress placed on the plantar fascia due to bone spurs. RISK INCREASES WITH:  Activities that place stress on the plantar fascia (running, jumping, pivoting, or cutting). Poor strength and flexibility. Improperly fitted shoes. Tight calf muscles. Flat feet. Failure to warm-up properly before activity. Obesity. PREVENTION Warm up and stretch properly before activity. Allow for adequate recovery between workouts. Maintain physical fitness: Strength, flexibility, and endurance. Cardiovascular fitness. Maintain a health body weight. Avoid stress on the plantar fascia. Wear properly fitted shoes, including arch supports for individuals who have flat feet.  PROGNOSIS  If treated properly, then the symptoms of plantar fasciitis usually resolve without surgery. However, occasionally surgery is necessary.  RELATED COMPLICATIONS  Recurrent symptoms that may result in a chronic condition. Problems of the lower back that are caused by compensating for the injury, such as limping. Pain or weakness of the foot during push-off following surgery. Chronic inflammation,  scarring, and partial or complete fascia tear, occurring more often from repeated injections.  TREATMENT  Treatment initially involves the use of ice and medication to help reduce pain and inflammation. The use of strengthening and stretching exercises may help reduce pain with activity, especially stretches of the Achilles tendon. These exercises may be performed at home or with a therapist. Your caregiver may recommend that you use heel cups of arch supports to help reduce stress on the plantar fascia. Occasionally, corticosteroid injections are given to reduce inflammation. If symptoms persist for greater than 6 months despite non-surgical (conservative), then surgery may be recommended.   MEDICATION  If pain medication is necessary, then nonsteroidal anti-inflammatory medications, such as aspirin and ibuprofen, or other minor pain relievers, such as acetaminophen, are often recommended. Do not take pain medication within 7 days before surgery. Prescription pain relievers may be given if deemed necessary by your caregiver. Use only as directed and only as much as you need. Corticosteroid injections may be given by your caregiver. These injections should be reserved for the most serious cases, because they may only be given a certain number of times.  HEAT AND COLD Cold treatment (icing) relieves pain and reduces inflammation. Cold treatment should be applied for 10 to 15 minutes every 2 to 3 hours for inflammation and pain and immediately after any activity that aggravates your symptoms. Use ice packs or massage the area with a piece of ice (ice massage). Heat treatment may be used prior to performing the stretching and strengthening activities prescribed by your caregiver, physical therapist, or athletic trainer. Use a heat pack or soak the injury in warm water.  SEEK IMMEDIATE MEDICAL CARE IF: Treatment seems to offer no benefit, or the condition worsens. Any medications  produce adverse side  effects.  EXERCISES- RANGE OF MOTION (ROM) AND STRETCHING EXERCISES - Plantar Fasciitis (Heel Spur Syndrome) These exercises may help you when beginning to rehabilitate your injury. Your symptoms may resolve with or without further involvement from your physician, physical therapist or athletic trainer. While completing these exercises, remember:  Restoring tissue flexibility helps normal motion to return to the joints. This allows healthier, less painful movement and activity. An effective stretch should be held for at least 30 seconds. A stretch should never be painful. You should only feel a gentle lengthening or release in the stretched tissue.  RANGE OF MOTION - Toe Extension, Flexion Sit with your right / left leg crossed over your opposite knee. Grasp your toes and gently pull them back toward the top of your foot. You should feel a stretch on the bottom of your toes and/or foot. Hold this stretch for 10 seconds. Now, gently pull your toes toward the bottom of your foot. You should feel a stretch on the top of your toes and or foot. Hold this stretch for 10 seconds. Repeat  times. Complete this stretch 3 times per day.   RANGE OF MOTION - Ankle Dorsiflexion, Active Assisted Remove shoes and sit on a chair that is preferably not on a carpeted surface. Place right / left foot under knee. Extend your opposite leg for support. Keeping your heel down, slide your right / left foot back toward the chair until you feel a stretch at your ankle or calf. If you do not feel a stretch, slide your bottom forward to the edge of the chair, while still keeping your heel down. Hold this stretch for 10 seconds. Repeat 3 times. Complete this stretch 2 times per day.   STRETCH  Gastroc, Standing Place hands on wall. Extend right / left leg, keeping the front knee somewhat bent. Slightly point your toes inward on your back foot. Keeping your right / left heel on the floor and your knee straight, shift  your weight toward the wall, not allowing your back to arch. You should feel a gentle stretch in the right / left calf. Hold this position for 10 seconds. Repeat 3 times. Complete this stretch 2 times per day.  STRETCH  Soleus, Standing Place hands on wall. Extend right / left leg, keeping the other knee somewhat bent. Slightly point your toes inward on your back foot. Keep your right / left heel on the floor, bend your back knee, and slightly shift your weight over the back leg so that you feel a gentle stretch deep in your back calf. Hold this position for 10 seconds. Repeat 3 times. Complete this stretch 2 times per day.  STRETCH  Gastrocsoleus, Standing  Note: This exercise can place a lot of stress on your foot and ankle. Please complete this exercise only if specifically instructed by your caregiver.  Place the ball of your right / left foot on a step, keeping your other foot firmly on the same step. Hold on to the wall or a rail for balance. Slowly lift your other foot, allowing your body weight to press your heel down over the edge of the step. You should feel a stretch in your right / left calf. Hold this position for 10 seconds. Repeat this exercise with a slight bend in your right / left knee. Repeat 3 times. Complete this stretch 2 times per day.   STRENGTHENING EXERCISES - Plantar Fasciitis (Heel Spur Syndrome)  These exercises may help you  when beginning to rehabilitate your injury. They may resolve your symptoms with or without further involvement from your physician, physical therapist or athletic trainer. While completing these exercises, remember:  Muscles can gain both the endurance and the strength needed for everyday activities through controlled exercises. Complete these exercises as instructed by your physician, physical therapist or athletic trainer. Progress the resistance and repetitions only as guided.  STRENGTH - Towel Curls Sit in a chair positioned on a  non-carpeted surface. Place your foot on a towel, keeping your heel on the floor. Pull the towel toward your heel by only curling your toes. Keep your heel on the floor. Repeat 3 times. Complete this exercise 2 times per day.  STRENGTH - Ankle Inversion Secure one end of a rubber exercise band/tubing to a fixed object (table, pole). Loop the other end around your foot just before your toes. Place your fists between your knees. This will focus your strengthening at your ankle. Slowly, pull your big toe up and in, making sure the band/tubing is positioned to resist the entire motion. Hold this position for 10 seconds. Have your muscles resist the band/tubing as it slowly pulls your foot back to the starting position. Repeat 3 times. Complete this exercises 2 times per day.  Document Released: 04/01/2005 Document Revised: 06/24/2011 Document Reviewed: 07/14/2008 [facility] Patient Information 2014 [place], [place].

## 2022-09-24 NOTE — Progress Notes (Signed)
  Subjective:  Patient ID: Valerie Maddox, female    DOB: 02-17-1951,  MRN: [id number]  Chief Complaint  Patient presents with   Diabetes    NP diabetic foot care   Foot Swelling    np - bil ankle & heel spurs / swelling - had xrays and injections in January - pain has returned    72 y.o. female presents with the above complaint. History confirmed with patient.  She saw Dr. Graciela Husbands at [facility] for this.  Has not done any home exercises for it as well.  Injection did help.  She also notes swelling in both legs  Objective:  Physical Exam: warm, good capillary refill, no trophic changes or ulcerative lesions, normal DP and PT pulses, normal sensory exam, and bilateral has pain to palpation on the medial band insertion of the plantar fascia at the medial heel, she does have gastrocnemius equinus bilaterally, peripheral edema pitting to the legs +1.   Assessment:   1. Plantar fasciitis of left foot   2. Plantar fasciitis of right foot   3. Peripheral edema      Plan:  Patient was evaluated and treated and all questions answered.   Discussed the etiology and treatment options for plantar fasciitis including stretching, formal physical therapy, supportive shoegears such as a running shoe or sneaker, pre fabricated orthoses, injection therapy, and oral medications. We also discussed the role of surgical treatment of this for patients who do not improve after exhausting non-surgical treatment options.   -Educated patient on stretching and icing of the affected limb -Injection delivered to the plantar fascia of both feet. -Home therapy plan dispensed. -Discussed with her that her peripheral edema does not appear to be consistent with or being caused by the plantar fasciitis, encouraged her to discuss this with her primary care doctor in regards to her blood pressure kidney function and sodium intake as well.  Discussed elevating the legs after activity and use of OTC compression  stockings as needed.  Return if symptoms worsen or fail to improve.

## 2023-01-25 ENCOUNTER — Other Ambulatory Visit: Payer: Self-pay

## 2023-01-25 ENCOUNTER — Emergency Department
Admission: EM | Admit: 2023-01-25 | Discharge: 2023-01-25 | Disposition: A | Discharge status: Home / Self Care | Payer: 59 | Attending: Emergency Medicine | Admitting: Emergency Medicine

## 2023-01-25 DIAGNOSIS — R059 Cough, unspecified: Secondary | ICD-10-CM | POA: Insufficient documentation

## 2023-01-25 DIAGNOSIS — E119 Type 2 diabetes mellitus without complications: Secondary | ICD-10-CM | POA: Insufficient documentation

## 2023-01-25 DIAGNOSIS — I10 Essential (primary) hypertension: Secondary | ICD-10-CM | POA: Diagnosis not present

## 2023-01-25 DIAGNOSIS — Z20822 Contact with and (suspected) exposure to covid-19: Secondary | ICD-10-CM | POA: Insufficient documentation

## 2023-01-25 LAB — RESP PANEL BY RT-PCR (RSV, FLU A&B, COVID)  RVPGX2
Influenza A by PCR: NEGATIVE
Influenza B by PCR: NEGATIVE
Resp Syncytial Virus by PCR: NEGATIVE
SARS Coronavirus 2 by RT PCR: NEGATIVE

## 2023-01-25 MED ORDER — BENZONATATE 100 MG PO CAPS: 100.0000 mg | ORAL_CAPSULE | Freq: Three times a day (TID) | ORAL | 0 refills | Status: AC | PRN

## 2023-01-25 NOTE — ED Triage Notes (Signed)
Pt sts that she has been having a cough for the last seven days. Pt sts that her son also has been having the same thing.

## 2023-01-25 NOTE — Discharge Instructions (Signed)
Your test for Covid, Influenza, and RSV was negative.  You can take Tessalon to help relieve the cough. Please follow up with your doctor this week if symptoms do not improve.

## 2023-01-25 NOTE — ED Provider Notes (Signed)
[facility] Provider Note    Event Date/Time   First MD Initiated Contact with Patient 01/25/23 1105     (approximate)   History   Cough   HPI  Valerie Maddox is a 72 y.o. female with a past history of hypertension diabetes who comes ED complaining of nonproductive cough for the past 7 days.  Reports that her family members also sick with a respiratory illness.  She denies fevers chills chest pain shortness of breath or any other complaints.  Taking her medicines, eating and drinking normally.     Physical Exam   Triage Vital Signs: ED Triage Vitals  Encounter Vitals Group     BP 01/25/23 1050 126/64     Systolic BP Percentile --      Diastolic BP Percentile --      Pulse Rate 01/25/23 1050 87     Resp 01/25/23 1050 19     Temp 01/25/23 1050 98.4 F (36.9 C)     Temp Source 01/25/23 1050 Oral     SpO2 01/25/23 1050 98 %     Weight 01/25/23 1048 250 lb (113.4 kg)     Height 01/25/23 1048 5\' 5"  (1.651 m)     Head Circumference --      Peak Flow --      Pain Score 01/25/23 1048 4     Pain Loc --      Pain Education --      Exclude from Growth Chart --     Most recent vital signs: Vitals:   01/25/23 1050  BP: 126/64  Pulse: 87  Resp: 19  Temp: 98.4 F (36.9 C)  SpO2: 98%    General: Awake, no distress.  CV:  Good peripheral perfusion.  Regular rate and rhythm Resp:  Normal effort.  Clear to auscultation bilaterally, no wheezing or crackles Abd:  No distention.  Other:  Oropharynx is normal.  Moist oral mucosa   ED Results / Procedures / Treatments   Labs (all labs ordered are listed, but only abnormal results are displayed) Labs Reviewed  RESP PANEL BY RT-PCR (RSV, FLU A&B, COVID)  [id number]     RADIOLOGY    PROCEDURES:  Procedures   MEDICATIONS ORDERED IN ED: Medications - No data to display   IMPRESSION / MDM / ASSESSMENT AND PLAN / ED COURSE  I reviewed the triage vital signs and the nursing notes.                              Patient presents with nonproductive cough for 7 days.  Likely viral.  No evidence of pneumonia.  Exam is benign, vitals are normal.   Considering the patient's symptoms, medical history, and physical examination today, I have low suspicion for ACS, PE, TAD, pneumothorax, carditis, mediastinitis, pneumonia, CHF, or sepsis.        FINAL CLINICAL IMPRESSION(S) / ED DIAGNOSES   Final diagnoses:  Cough, unspecified type     Rx / DC Orders   ED Discharge Orders          Ordered    benzonatate (TESSALON PERLES) 100 MG capsule  3 times daily PRN        01/25/23 1138             Note:  This document was prepared using Dragon voice recognition software and may include unintentional dictation errors.   Sharman Cheek, MD 01/25/23 [id number]

## 2023-01-31 ENCOUNTER — Ambulatory Visit
Admission: EM | Admit: 2023-01-31 | Discharge: 2023-01-31 | Disposition: A | Discharge status: Home / Self Care | Payer: 59 | Attending: Family Medicine | Admitting: Family Medicine

## 2023-01-31 ENCOUNTER — Ambulatory Visit (INDEPENDENT_AMBULATORY_CARE_PROVIDER_SITE_OTHER): Payer: 59

## 2023-01-31 DIAGNOSIS — J45909 Unspecified asthma, uncomplicated: Secondary | ICD-10-CM | POA: Insufficient documentation

## 2023-01-31 DIAGNOSIS — R32 Unspecified urinary incontinence: Secondary | ICD-10-CM | POA: Diagnosis not present

## 2023-01-31 DIAGNOSIS — R109 Unspecified abdominal pain: Secondary | ICD-10-CM | POA: Diagnosis not present

## 2023-01-31 DIAGNOSIS — J209 Acute bronchitis, unspecified: Secondary | ICD-10-CM | POA: Insufficient documentation

## 2023-01-31 DIAGNOSIS — Z1152 Encounter for screening for COVID-19: Secondary | ICD-10-CM | POA: Insufficient documentation

## 2023-01-31 DIAGNOSIS — R059 Cough, unspecified: Secondary | ICD-10-CM | POA: Diagnosis not present

## 2023-01-31 LAB — URINALYSIS, W/ REFLEX TO CULTURE (INFECTION SUSPECTED)
Bilirubin Urine: NEGATIVE
Glucose, UA: NEGATIVE mg/dL
Hgb urine dipstick: NEGATIVE
Ketones, ur: NEGATIVE mg/dL
Nitrite: NEGATIVE
Protein, ur: NEGATIVE mg/dL
Specific Gravity, Urine: 1.02 (ref 1.005–1.030)
pH: 6 (ref 5.0–8.0)

## 2023-01-31 LAB — RESP PANEL BY RT-PCR (RSV, FLU A&B, COVID)  RVPGX2
Influenza A by PCR: NEGATIVE
Influenza B by PCR: NEGATIVE
Resp Syncytial Virus by PCR: NEGATIVE
SARS Coronavirus 2 by RT PCR: NEGATIVE

## 2023-01-31 MED ORDER — HYDROCOD POLI-CHLORPHE POLI ER 10-8 MG/5ML PO SUER: 2.5000 mL | Freq: Two times a day (BID) | ORAL | 0 refills | Status: AC | PRN

## 2023-01-31 MED ORDER — CEFDINIR 300 MG PO CAPS: 300.0000 mg | ORAL_CAPSULE | Freq: Two times a day (BID) | ORAL | 0 refills | Status: AC

## 2023-01-31 NOTE — ED Triage Notes (Signed)
Pt is with her sister.  Pt c/o nasal congestion, coughing x5days  Pt states that the coughing hurts her stomach.  Pt recently went to [place] for 6 weeks and returned 01/18/23  Pt Does not know her medications and forgot her medication list.

## 2023-01-31 NOTE — Discharge Instructions (Addendum)
Your COVID, influenza and RSV (respiratory syncytial virus) were all negative.  On my review of your xray images, you did not have any evidence of pneumonia however the radiologist has not yet read your xray. If it is significantly abnormal or urgent, someone will contact you.  You should see your results in MyChart.  Your urine showed a lot of white blood cells which may be a start of an infection.  I sent it for culture to see if we need to change the antibiotics prescribed.  Someone will call you if we need to do so.  Stop by the pharmacy to pick up your prescriptions.  Follow up with your primary care provider as needed.

## 2023-01-31 NOTE — ED Provider Notes (Signed)
[facility]    CSN: [id number] Arrival date & time: 01/31/23  1736      History   Chief Complaint Chief Complaint  Patient presents with   Cough   Abdominal Pain   Nasal Congestion    HPI TIEESHA WOFFORD is a 72 y.o. female.   HPI  History obtained from the patient and sister . Chloye presents for cough, rhinorrhea and nasal congestion that started over a week. She went to the emergency department who told her it was just a cold and prescribed her some medication but she didn't take it because they didn't know what it was for. No fever, vomiting, diarrhea. Has urinary incontience and abdominal pain with cough. Has been needing to wear incontience pads.    Fever : no  Chills: no Sore throat: no   Cough: no Sputum: no Chest tightness: no Shortness of breath: no Wheezing: no  Nasal congestion : no  Rhinorrhea: no Myalgias: no Appetite: normal  Hydration: normal  Abdominal pain: no Nausea: no Vomiting: no Diarrhea: No Rash: No Sleep disturbance: no Headache: no      Past Medical History:  Diagnosis Date   Anemia    Arthritis    osteo   Asthma    Diabetes mellitus without complication (HCC)    Glaucoma    right eye early stage.   Hard of hearing    History of kidney stones    Hypertension    Sleep apnea     Patient Active Problem List   Diagnosis Date Noted   Bilateral ovarian cysts 12/19/2021   Atrophic vaginitis 01/13/2019   Gastroesophageal reflux disease without esophagitis 01/13/2019   Chronic right-sided low back pain without sciatica 06/19/2018   Sleep apnea 09/19/2015   Morbid obesity with BMI of 40.0-44.9, adult (HCC) 09/15/2015   Hyperlipidemia 11/07/2014   Type 2 diabetes mellitus with diabetic cataract, with long-term current use of insulin (HCC) 09/04/2013   Chronic pain 07/11/2011   Essential hypertension 07/11/2011   Sensorineural hearing loss of both ears 04/24/2011    Past Surgical History:  Procedure Laterality  Date   ABDOMINAL HYSTERECTOMY  1998   COLONOSCOPY  03/16/2009   COLONOSCOPY WITH PROPOFOL N/A 06/14/2019   Procedure: COLONOSCOPY WITH PROPOFOL;  Surgeon: Toledo, Boykin Nearing, MD;  Location: [facility] ENDOSCOPY;  Service: Gastroenterology;  Laterality: N/A;   KNEE ARTHROSCOPY Left 2005   shoulder Right 06/2019   TOE SURGERY Left    corn removal   TOE SURGERY Right     OB History   No obstetric history on file.      Home Medications    Prior to Admission medications   Medication Sig Start Date End Date Taking? Authorizing Provider  amLODipine (NORVASC) 5 MG tablet Take 5 mg by mouth daily.    [provider]  amoxicillin-clavulanate (AUGMENTIN) 875-125 MG tablet Take 1 tablet by mouth 2 (two) times daily. Patient not taking: Reported on 06/14/2019 04/10/18   Cuthriell, Delorise Royals, PA-C  aspirin 81 MG tablet Take 81 mg by mouth daily.    [provider]  benzonatate (TESSALON PERLES) 100 MG capsule Take 1 capsule (100 mg total) by mouth 3 (three) times daily as needed for cough. 01/25/23 01/25/24  Sharman Cheek, MD  brompheniramine-pseudoephedrine-DM 30-2-10 MG/5ML syrup Take 10 mLs by mouth 4 (four) times daily as needed. 04/10/18   Cuthriell, Delorise Royals, PA-C  diclofenac Sodium (VOLTAREN) 1 % GEL Apply 2 grams topically Q 6 hrs prn 01/13/19  [provider]  Docusate Sodium (DSS) 100 MG CAPS Take by mouth. 07/11/11   [provider]  dorzolamide-timolol (COSOPT) 22.3-6.8 MG/ML ophthalmic solution 1 drop 2 (two) times daily.    [provider]  econazole nitrate 1 % cream Apply topically 2 (two) times daily. 03/18/22   [provider]  etodolac (LODINE) 200 MG capsule Take 200 mg by mouth every 8 (eight) hours.    [provider]  fluticasone (FLONASE) 50 MCG/ACT nasal spray Place 1 spray into both nostrils daily.    [provider]  furosemide (LASIX) 20 MG tablet Take 20 mg by mouth daily.    [provider]   gabapentin (NEURONTIN) 300 MG capsule Take 300 mg by mouth 3 (three) times daily.    [provider]  insulin lispro protamine-lispro (HUMALOG 75/25 MIX) (75-25) 100 UNIT/ML SUSP injection Inject 180 Units into the skin 2 (two) times daily with a meal. 180 units in the am and 66 units in the evenings as directed.    [provider]  latanoprost (XALATAN) 0.005 % ophthalmic solution INSTILL 1 DROP INTO BOTH EYES EVERY NIGHT AT BEDTIME 04/24/11   [provider]  loratadine (CLARITIN) 10 MG tablet Take 1 tablet (10 mg total) by mouth daily. 02/26/15 02/26/16  Tommi Rumps, PA-C  lovastatin (MEVACOR) 40 MG tablet Take 40 mg by mouth at bedtime.    [provider]  metFORMIN (GLUCOPHAGE) 500 MG tablet Take 500 mg by mouth 2 (two) times daily with a meal.    [provider]  omeprazole (PRILOSEC) 40 MG capsule Take 40 mg by mouth daily.    [provider]  OZEMPIC, 1 MG/DOSE, 4 MG/3ML SOPN Inject into the skin.    [provider]  potassium chloride SA (KLOR-CON) 20 MEQ tablet Take 20 mEq by mouth 2 (two) times daily.    [provider]  PREMARIN vaginal cream INSERT A SMALL AMOUNT(0.5 GRAMS) VAGINALLY 2-4 TIMES A WEEK 06/26/21   [provider]  sulfamethoxazole-trimethoprim (BACTRIM DS,SEPTRA DS) 800-160 MG tablet Take 1 tablet by mouth 2 (two) times daily. Patient not taking: Reported on 06/14/2019 10/09/16   Bridget Hartshorn L, PA-C  timolol (BETIMOL) 0.5 % ophthalmic solution 1 drop 2 (two) times daily.    [provider]  timolol (TIMOPTIC) 0.5 % ophthalmic solution instill 1 drop into both eyes every morning 11/01/15   [provider]  traMADol (ULTRAM) 50 MG tablet Take 1 tablet (50 mg total) by mouth every 6 (six) hours as needed. 03/23/20   Becky Augusta, NP  triamcinolone (NASACORT) 55 MCG/ACT AERO nasal inhaler Place 1 spray into the nose 2 (two) times daily. 04/10/18 04/10/19  Cuthriell, Delorise Royals,  PA-C  valsartan-hydrochlorothiazide (DIOVAN-HCT) 160-12.5 MG tablet Take 1 tablet by mouth daily.    [provider]    Family History Family History  Problem Relation Age of Onset   Breast cancer Sister    Breast cancer Sister     Social History Social History   Tobacco Use   Smoking status: Never   Smokeless tobacco: Never  Vaping Use   Vaping status: Never Used  Substance Use Topics   Alcohol use: No   Drug use: Never     Allergies   Fluticasone   Review of Systems Review of Systems: negative unless otherwise stated in HPI.      Physical Exam Triage Vital Signs ED Triage Vitals  Encounter Vitals Group     BP  01/31/23 1748 124/73     Systolic BP Percentile --      Diastolic BP Percentile --      Pulse Rate 01/31/23 1748 93     Resp --      Temp 01/31/23 1748 98.7 F (37.1 C)     Temp Source 01/31/23 1748 Oral     SpO2 01/31/23 1748 95 %     Weight 01/31/23 1745 245 lb (111.1 kg)     Height 01/31/23 1745 5\' 5"  (1.651 m)     Head Circumference --      Peak Flow --      Pain Score 01/31/23 1744 0     Pain Loc --      Pain Education --      Exclude from Growth Chart --    No data found.  Updated Vital Signs BP 124/73 (BP Location: Left Arm)   Pulse 93   Temp 98.7 F (37.1 C) (Oral)   Ht 5\' 5"  (1.651 m)   Wt 111.1 kg   SpO2 95%   BMI 40.77 kg/m   Visual Acuity Right Eye Distance:   Left Eye Distance:   Bilateral Distance:    Right Eye Near:   Left Eye Near:    Bilateral Near:     Physical Exam GEN:     alert, non-toxic appearing female in no distress ***   HENT:  mucus membranes moist, oropharyngeal ***without lesions or ***erythema, no*** tonsillar hypertrophy or exudates, *** moderate erythematous edematous turbinates, ***clear nasal discharge, ***bilateral TM normal EYES:   pupils equal and reactive, ***no scleral injection or discharge NECK:  normal ROM, no ***lymphadenopathy, ***no meningismus   RESP:  no increased work of  breathing, ***clear to auscultation bilaterally CVS:   regular rate ***and rhythm Skin:   warm and dry, no rash on visible skin***    UC Treatments / Results  Labs (all labs ordered are listed, but only abnormal results are displayed) Labs Reviewed  RESP PANEL BY RT-PCR (RSV, FLU A&B, COVID)  [id number]    EKG   Radiology No results found.  Procedures Procedures (including critical care time)  Medications Ordered in UC Medications - No data to display  Initial Impression / Assessment and Plan / UC Course  I have reviewed the triage vital signs and the nursing notes.  Pertinent labs & imaging results that were available during my care of the patient were reviewed by me and considered in my medical decision making (see chart for details).       Pt is a 72 y.o. female who presents for *** days of respiratory symptoms. Cliffie is ***afebrile here without recent antipyretics. Satting well on room air. Overall pt is ***non-toxic appearing, well hydrated, without respiratory distress. Pulmonary exam ***is unremarkable.  COVID testing obtained ***and was negative. ***Pt to quarantine until COVID test results or longer if positive.  I will call patient with test results, if positive. History consistent with ***viral respiratory illness. Discussed symptomatic treatment.  Explained lack of efficacy of antibiotics in viral disease.  Typical duration of symptoms discussed.   Return and ED precautions given and voiced understanding. Discussed MDM, treatment plan and plan for follow-up with patient*** who agrees with plan.     Final Clinical Impressions(s) / UC Diagnoses   Final diagnoses:  None   Discharge Instructions   None    ED Prescriptions   None    PDMP not reviewed this encounter.

## 2023-02-03 ENCOUNTER — Telehealth: Payer: Self-pay

## 2023-02-03 LAB — URINE CULTURE: Culture: >=100000 — AB

## 2023-02-03 MED ORDER — NITROFURANTOIN MONOHYD MACRO 100 MG PO CAPS: 100.0000 mg | ORAL_CAPSULE | Freq: Two times a day (BID) | ORAL | 0 refills | Status: AC

## 2023-02-03 NOTE — Telephone Encounter (Signed)
Per protocol, pt to dc Cefdinir and begin tx with Macrobid.  Reviewed with patient, verified pharmacy, prescription sent.

## 2023-06-10 ENCOUNTER — Other Ambulatory Visit: Payer: Self-pay | Admitting: Family Medicine

## 2023-06-10 DIAGNOSIS — Z1231 Encounter for screening mammogram for malignant neoplasm of breast: Secondary | ICD-10-CM

## 2023-06-25 ENCOUNTER — Ambulatory Visit
Admission: RE | Admit: 2023-06-25 | Discharge: 2023-06-25 | Disposition: A | Discharge status: Home / Self Care | Payer: 59 | Source: Ambulatory Visit | Attending: Family Medicine | Admitting: Family Medicine

## 2023-06-25 DIAGNOSIS — Z1231 Encounter for screening mammogram for malignant neoplasm of breast: Secondary | ICD-10-CM | POA: Insufficient documentation

## 2024-02-29 ENCOUNTER — Emergency Department
Admission: EM | Admit: 2024-02-29 | Discharge: 2024-02-29 | Disposition: A | Discharge status: Home / Self Care | Attending: Emergency Medicine | Admitting: Emergency Medicine

## 2024-02-29 DIAGNOSIS — M7989 Other specified soft tissue disorders: Secondary | ICD-10-CM | POA: Diagnosis present

## 2024-02-29 DIAGNOSIS — M5412 Radiculopathy, cervical region: Secondary | ICD-10-CM | POA: Insufficient documentation

## 2024-02-29 DIAGNOSIS — M65841 Other synovitis and tenosynovitis, right hand: Secondary | ICD-10-CM

## 2024-02-29 DIAGNOSIS — M65941 Unspecified synovitis and tenosynovitis, right hand: Secondary | ICD-10-CM | POA: Diagnosis not present

## 2024-02-29 NOTE — ED Provider Notes (Signed)
 [facility] Provider Note    Event Date/Time   First MD Initiated Contact with Patient 02/29/24 [phone]     (approximate)   History   Arm Pain   HPI Valerie Maddox is a 73 y.o. female with multiple chronic medical issues including being hard of hearing which slightly complicates the communications.  She presents for evaluation of 1-1/2 weeks of symptoms that include contraction of her right middle finger that requires her to soak her hand in warm water to get it to loosen up and extend the finger again.  The finger itself is not swollen or painful, but she feels a stinging pain that goes up her arm to the middle of her right shoulder (the deltoid muscle).  She has had no loss of sensation, numbness, nor weakness, just the difficulty extending her finger and the stinging pain.  She was unaware that she currently takes gabapentin for neuropathy.  She has no pain in her neck and no pain more proximal than the middle of her deltoid of the right arm.  No recent falls.  No swelling or infection that she is aware of in the finger or arm.     Physical Exam   Triage Vital Signs: ED Triage Vitals  Encounter Vitals Group     BP 02/29/24 0533 (!) 150/62     Girls Systolic BP Percentile --      Girls Diastolic BP Percentile --      Boys Systolic BP Percentile --      Boys Diastolic BP Percentile --      Pulse Rate 02/29/24 0533 89     Resp 02/29/24 0533 16     Temp 02/29/24 0533 98.4 F (36.9 C)     Temp src --      SpO2 02/29/24 0533 99 %     Weight --      Height --      Head Circumference --      Peak Flow --      Pain Score 02/29/24 0532 9     Pain Loc --      Pain Education --      Exclude from Growth Chart --     Most recent vital signs: Vitals:   02/29/24 0533 02/29/24 0656  BP: (!) 150/62   Pulse: 89   Resp: 16   Temp: 98.4 F (36.9 C)   SpO2: 99% 99%    General: Awake, no distress.   CV:  Good peripheral perfusion. Easily palpable right  radial pulse.  Normal capillary refill in all of her fingers. Resp:  Normal effort. Speaking easily and comfortably, no accessory muscle usage nor intercostal retractions.   Abd:  No distention.  Other:  Currently the patient has normal range of motion of all of her fingers including the middle finger.  She has normal grip strength and is able to fully flex and extend the finger painlessly and without any apparent difficulty.  However, she says that it is because she soaked her finger before coming to the ED, and by later today it will be contracted again.  There is no erythema or fusiform swelling, no tenderness to palpation along the tendon sheath(s).     ED Results / Procedures / Treatments   Labs (all labs ordered are listed, but only abnormal results are displayed) Labs Reviewed - No data to display   PROCEDURES:  Critical Care performed: No  Procedures    IMPRESSION / MDM /  ASSESSMENT AND PLAN / ED COURSE  I reviewed the triage vital signs and the nursing notes.                              Differential diagnosis includes, but is not limited to, cervical radiculopathy, stenosing tenosynovitis (trigger finger), Dupuytren's contracture.  CVA is very unlikely.  Patient's presentation is most consistent with acute presentation with potential threat to life or bodily function.   Interventions/Medications given:  Medications - No data to display  (Note:  hospital course my include additional interventions and/or labs/studies not listed above.)   I have verified and the medical record that the patient is taking a high dose of gabapentin already and I do not think that increasing that would be beneficial.  There is no indication she has any sort of infection including flexor tenosynovitis.  The waxing and waning nature of the symptoms, improved with soaking in hot water, with suggest stenosing tenosynovitis or possibly Dupuytren's contracture, although I suspect the  former.  Rather than engaging in an invasive procedure such as a cortisone injection, I think this would be best managed and evaluated by a hand specialist.  In the meantime I splinted her finger and we talked about conservative management.  She will call to schedule follow-up appointment.  I gave my usual and customary return precautions         FINAL CLINICAL IMPRESSION(S) / ED DIAGNOSES   Final diagnoses:  Stenosing tenosynovitis of finger of right hand  Cervical radiculopathy     Rx / DC Orders   ED Discharge Orders     None        Note:  This document was prepared using Dragon voice recognition software and may include unintentional dictation errors.   Gordan Huxley, MD 02/29/24 [phone]

## 2024-02-29 NOTE — ED Triage Notes (Signed)
 Pt to ED with c/o right arm pain. Pt states it has been ongoing all week. Unable to get into her PCPs office. Pain described as 9/10 and burning. Denies injury, denies hx of similar pain.

## 2024-02-29 NOTE — Discharge Instructions (Signed)
 As we discussed, we recommend you try using the finger splint you are provided to keep your finger extended.  Soaking it in warm water at times is a good idea as well.  Continue taking your medications, especially your Neurontin (gabapentin) which can help with nerve pain.  We recommend you call and schedule a follow-up appointment with Dr. Ezra who is a hand specialist and may be able to help you more with additional testing and management of your condition.  You should also follow-up with your primary care provider.  Return to the emergency department if you develop new or worsening symptoms that concern you.

## 2024-06-28 ENCOUNTER — Ambulatory Visit
